# Patient Record
Sex: Male | Born: 1985 | Race: White | Hispanic: No | Marital: Single | Smoking: Current every day smoker
Health system: Southern US, Community
[De-identification: ages and names within clinical notes are randomized; demographics above are authoritative.]

## PROBLEM LIST (undated history)

## (undated) DIAGNOSIS — J45909 Unspecified asthma, uncomplicated: Secondary | ICD-10-CM

## (undated) DIAGNOSIS — R7989 Other specified abnormal findings of blood chemistry: Secondary | ICD-10-CM

## (undated) DIAGNOSIS — K219 Gastro-esophageal reflux disease without esophagitis: Secondary | ICD-10-CM

## (undated) DIAGNOSIS — I480 Paroxysmal atrial fibrillation: Secondary | ICD-10-CM

## (undated) DIAGNOSIS — K635 Polyp of colon: Secondary | ICD-10-CM

## (undated) DIAGNOSIS — L0390 Cellulitis, unspecified: Secondary | ICD-10-CM

## (undated) DIAGNOSIS — K2270 Barrett's esophagus without dysplasia: Secondary | ICD-10-CM

## (undated) HISTORY — DX: Paroxysmal atrial fibrillation: I48.0

## (undated) HISTORY — DX: Polyp of colon: K63.5

## (undated) HISTORY — DX: Other specified abnormal findings of blood chemistry: R79.89

## (undated) HISTORY — DX: Cellulitis, unspecified: L03.90

## (undated) HISTORY — DX: Barrett's esophagus without dysplasia: K22.70

## (undated) HISTORY — DX: Gastro-esophageal reflux disease without esophagitis: K21.9

---

## 2012-05-31 ENCOUNTER — Ambulatory Visit (INDEPENDENT_AMBULATORY_CARE_PROVIDER_SITE_OTHER): Payer: BC Managed Care – PPO | Admitting: Physician Assistant

## 2012-05-31 VITALS — BP 129/79 | HR 69 | Temp 98.1°F | Resp 18 | Ht 72.0 in | Wt 210.0 lb

## 2012-05-31 DIAGNOSIS — L02219 Cutaneous abscess of trunk, unspecified: Secondary | ICD-10-CM

## 2012-05-31 DIAGNOSIS — R208 Other disturbances of skin sensation: Secondary | ICD-10-CM

## 2012-05-31 DIAGNOSIS — R209 Unspecified disturbances of skin sensation: Secondary | ICD-10-CM

## 2012-05-31 MED ORDER — DOXYCYCLINE HYCLATE 100 MG PO CAPS: 100.0000 mg | ORAL_CAPSULE | Freq: Two times a day (BID) | ORAL | Status: AC

## 2012-05-31 NOTE — Progress Notes (Signed)
  Subjective:    Patient ID: Bernard Mcbride, male    DOB: Jan 13, 1986, 26 y.o.   MRN: [id number]  HPI Presents with sore spot on left waistline first noticed 2 days ago.  Initially he thought it was just where his pants were rubbing, but then realized it was "an infection."  He has a history of recurrent cellulitis.  He squeezed some thick pus out yesterday.  No fever, chills, nausea.  He reports feeling "under the weather" and tired, but nothing more specific. Works outside doing Youth worker.   Review of Systems As above.    Objective:   Physical Exam Vital signs noted. Well-developed, well nourished WM who is awake, alert and oriented, in NAD. Lungs: normal effort. Abdomen: normo-active bowel sounds, supple, non-tender, no mass or organomegaly. Skin: warm and dry. Area of erythema on the left anterior waist line with central induration, fluctuence and eschar.  Eschar lifted and a small amount of purulence expressed and collected for culture. Mupirocin ointment and bandaid applied.     Assessment & Plan:   1. Cellulitis/abscess - trunk  Wound culture, doxycycline (VIBRAMYCIN) 100 MG capsule  2. Pain of skin

## 2012-05-31 NOTE — Patient Instructions (Signed)
Apply a warm compress to the area for 15-20 minutes 2-3 times daily.  Keep the wound covered while it's actively draining, but leave it open to the air for some timer every day after that, during a time when you can keep it clean.  Wash the wound with soap and water daily.

## 2012-06-03 LAB — WOUND CULTURE
Gram Stain: NONE SEEN
Gram Stain: NONE SEEN

## 2013-06-23 ENCOUNTER — Telehealth: Payer: Self-pay

## 2013-06-23 ENCOUNTER — Ambulatory Visit (INDEPENDENT_AMBULATORY_CARE_PROVIDER_SITE_OTHER): Payer: BC Managed Care – PPO | Admitting: Physician Assistant

## 2013-06-23 VITALS — BP 130/98 | HR 66 | Temp 97.4°F | Resp 16 | Ht 72.5 in | Wt 215.0 lb

## 2013-06-23 DIAGNOSIS — M545 Other chronic pain: Secondary | ICD-10-CM | POA: Insufficient documentation

## 2013-06-23 DIAGNOSIS — J309 Allergic rhinitis, unspecified: Secondary | ICD-10-CM

## 2013-06-23 DIAGNOSIS — K219 Gastro-esophageal reflux disease without esophagitis: Secondary | ICD-10-CM

## 2013-06-23 DIAGNOSIS — J4521 Mild intermittent asthma with (acute) exacerbation: Secondary | ICD-10-CM

## 2013-06-23 DIAGNOSIS — R05 Cough: Secondary | ICD-10-CM

## 2013-06-23 DIAGNOSIS — J302 Other seasonal allergic rhinitis: Secondary | ICD-10-CM

## 2013-06-23 DIAGNOSIS — J45901 Unspecified asthma with (acute) exacerbation: Secondary | ICD-10-CM

## 2013-06-23 DIAGNOSIS — G8929 Other chronic pain: Secondary | ICD-10-CM

## 2013-06-23 MED ORDER — ESOMEPRAZOLE MAGNESIUM 40 MG PO PACK: 40.0000 mg | PACK | Freq: Every day | ORAL | Status: DC

## 2013-06-23 MED ORDER — BECLOMETHASONE DIPROPIONATE 80 MCG/ACT IN AERS: 1.0000 | INHALATION_SPRAY | RESPIRATORY_TRACT | Status: DC | PRN

## 2013-06-23 MED ORDER — ALBUTEROL SULFATE HFA 108 (90 BASE) MCG/ACT IN AERS: 2.0000 | INHALATION_SPRAY | RESPIRATORY_TRACT | Status: DC | PRN

## 2013-06-23 MED ORDER — GUAIFENESIN ER 1200 MG PO TB12: 1.0000 | ORAL_TABLET | Freq: Two times a day (BID) | ORAL | Status: DC | PRN

## 2013-06-23 MED ORDER — BECLOMETHASONE DIPROPIONATE 80 MCG/ACT IN AERS: 2.0000 | INHALATION_SPRAY | Freq: Two times a day (BID) | RESPIRATORY_TRACT | Status: DC

## 2013-06-23 MED ORDER — BENZONATATE 100 MG PO CAPS: 100.0000 mg | ORAL_CAPSULE | Freq: Three times a day (TID) | ORAL | Status: DC | PRN

## 2013-06-23 MED ORDER — IPRATROPIUM BROMIDE 0.03 % NA SOLN: 2.0000 | Freq: Two times a day (BID) | NASAL | Status: DC

## 2013-06-23 NOTE — Telephone Encounter (Signed)
Call from mother. Inhalers not working. Increased cough. Advised take son to ER for evluation and treatment. He lives in an appt. And she was calling. Call at 11 PM

## 2013-06-23 NOTE — Progress Notes (Signed)
  Subjective:    Patient ID: Bernard Mcbride, male    DOB: [birth year], 27 y.o.   MRN: [id number]  HPI  This 27 y.o. male presents for evaluation of cough x 3 weeks. Worse at night.  Occurs in jags, gasps for air during them.  Dry cough.  Has missed >1 week of work due to coughing. An inhaler helps (has one left over from something previously) temporarily (1-2 hours).  Has had this several times before, usually in the fall, treatment helps, but then symptoms recur.  This episode began when staying on a houseboat in [place]. Initially had nausea, but that resolved after the first 1-2 days.  Also initially had achiness. Sometimes feels "Short of breath when I shouldn't be," even when not coughing.  Past medical history, surgical history, family history, social history and problem list reviewed.  He has a history of "terrible GERD," but ran out of Nexium about the time his cough started.  Review of Systems As above.  Some blood-tinged sputum with particularly hard coughing. Some mild nasal congestion and drainage.    Objective:   Physical Exam Blood pressure 130/98, pulse 66, temperature 97.4 F (36.3 C), temperature source Oral, resp. rate 16, height 6' 0.5" (1.842 m), weight 215 lb (97.523 kg), SpO2 98.00%. Body mass index is 28.74 kg/(m^2). Well-developed, well nourished WM who is awake, alert and oriented, in NAD. HEENT: [place]/AT, PERRL, EOMI.  Sclera and conjunctiva are clear.  EAC are patent, TMs are normal in appearance. Nasal mucosa is pink and moist. OP is clear. Neck: supple, non-tender, no lymphadenopathy, thyromegaly. Heart: RRR, no murmur Lungs: normal effort, soft wheezes noted throughout. Extremities: no cyanosis, clubbing or edema. Skin: warm and dry without rash. Psychologic: good mood and appropriate affect, normal speech and behavior.        Assessment & Plan:  Cough - Plan: benzonatate (TESSALON) 100 MG capsule  Reactive airway disease, mild intermittent, with acute  exacerbation - Plan: albuterol (PROVENTIL HFA;VENTOLIN HFA) 108 (90 BASE) MCG/ACT inhaler, beclomethasone (QVAR) 80 MCG/ACT inhaler, DISCONTINUED: beclomethasone (QVAR) 80 MCG/ACT inhaler, likely exacerbated by GERD.  Seasonal allergic rhinitis - Plan: ipratropium (ATROVENT) 0.03 % nasal spray, Guaifenesin (MUCINEX MAXIMUM STRENGTH) 1200 MG TB12  GERD (gastroesophageal reflux disease) - Plan: esomeprazole (NEXIUM) 40 MG packet  Chronic low back pain - use acetaminophen for now.  Reassess once his GERD and cough are stable.  Consider use of meloxicam at that time.    Fernande Bras, PA-C Physician Assistant-Certified [facility]

## 2013-06-23 NOTE — Telephone Encounter (Signed)
Spoke with mother (sharon) and states inhalers and medications are not doing any good. He is still coughing bad. States was told to call if not better and maybe start on steroids or give a different cough medication. Please send to [facility] since they are 24 hours.

## 2013-06-23 NOTE — Telephone Encounter (Signed)
Pt was seen today by chelle and was told to call back to talk with her if not improving mom called stating patient is getting worse  Best number [phone]

## 2013-06-23 NOTE — Patient Instructions (Addendum)
Add an OTC antihistamine (like Allegra, Claritin or Zyrtec) daily.  Use acetaminophen (Tylenol) as needed for back pain, until we get the reflux and cough resolved.

## 2013-06-24 ENCOUNTER — Ambulatory Visit: Payer: BC Managed Care – PPO

## 2013-06-24 ENCOUNTER — Ambulatory Visit (INDEPENDENT_AMBULATORY_CARE_PROVIDER_SITE_OTHER): Payer: BC Managed Care – PPO | Admitting: Family Medicine

## 2013-06-24 VITALS — BP 110/80 | Temp 97.4°F | Resp 18 | Wt 214.0 lb

## 2013-06-24 DIAGNOSIS — R05 Cough: Secondary | ICD-10-CM

## 2013-06-24 DIAGNOSIS — J45901 Unspecified asthma with (acute) exacerbation: Secondary | ICD-10-CM

## 2013-06-24 DIAGNOSIS — J4521 Mild intermittent asthma with (acute) exacerbation: Secondary | ICD-10-CM

## 2013-06-24 DIAGNOSIS — J209 Acute bronchitis, unspecified: Secondary | ICD-10-CM

## 2013-06-24 LAB — POCT CBC
Hemoglobin: 17.4 g/dL (ref 14.1–18.1)
MPV: 10.6 fL (ref 0–99.8)
POC Granulocyte: 7.4 — AB (ref 2–6.9)
POC MID %: 5.4 %M (ref 0–12)
RBC: 5.76 M/uL (ref 4.69–6.13)

## 2013-06-24 MED ORDER — HYDROCOD POLST-CHLORPHEN POLST 10-8 MG/5ML PO LQCR
5.0000 mL | Freq: Two times a day (BID) | ORAL | Status: DC | PRN
Start: 2013-06-24 — End: 2013-06-30

## 2013-06-24 MED ORDER — AZITHROMYCIN 250 MG PO TABS: ORAL_TABLET | ORAL | Status: DC

## 2013-06-24 MED ORDER — PREDNISONE 10 MG PO TABS: ORAL_TABLET | ORAL | Status: DC

## 2013-06-24 NOTE — Progress Notes (Signed)
Subjective:    Patient ID: Bernard Mcbride, male    DOB: [birth year], 27 y.o.   MRN: [id number]   Chief Complaint  Patient presents with  . Cough  . Follow-up   HPI   For the past 3 wks has been avhing really severe coughing, mostly at night.  Will almost invariably start coughing around midnight - has bouts where no matter what he can't stop coughing.  Even while using albuterol and QVAR.  Albuterol seemed to be making it worse.  Did try the on-call doctor, Dr. Cleta Mcbride, who recommended that he be seen today for further eval.  Coughing fits can last for over 30 min.  Unable to sleep hardly at all. No further sputum production so no bloody sputum.  Coughing to the point of regurg now.  If he coughs a lot, will sometimes develop some nasal congestion or sneeze but no sig URI-type sxs. No f/c - though did when this first started. Has not smoked a cigarette x 3-4d. Does have a h/o seasonal allergies accompanied by seasonal environmental asthma but normally only flairs in the fall.  Review of Systems  Constitutional: Positive for activity change, appetite change and fatigue. Negative for fever and chills.  HENT: Negative for ear pain, congestion, sore throat, rhinorrhea, sneezing, trouble swallowing, neck pain, neck stiffness, postnasal drip, sinus pressure and ear discharge.   Respiratory: Positive for cough and wheezing. Negative for shortness of breath.   Cardiovascular: Negative for chest pain.  Gastrointestinal: Positive for vomiting. Negative for nausea, abdominal pain, diarrhea and constipation.  Genitourinary: Negative for dysuria and difficulty urinating.  Musculoskeletal: Negative for myalgias.  Hematological: Negative for adenopathy.  Psychiatric/Behavioral: Positive for sleep disturbance.      BP 110/80  Temp(Src) 97.4 F (36.3 C) (Oral)  Resp 18  Wt 214 lb (97.07 kg)  BMI 28.61 kg/m2  SpO2 98% Objective:   Physical Exam  Constitutional: He is oriented to person, place, and  time. He appears well-developed and well-nourished. No distress.  HENT:  Head: Normocephalic and atraumatic.  Right Ear: Tympanic membrane, external ear and ear canal normal.  Left Ear: Tympanic membrane, external ear and ear canal normal.  Nose: Nose normal.  Mouth/Throat: Oropharynx is clear and moist and mucous membranes are normal. No oropharyngeal exudate.  Eyes: Conjunctivae are normal. No scleral icterus.  Neck: Normal range of motion. Neck supple. No thyromegaly present.  Cardiovascular: Normal rate, regular rhythm, normal heart sounds and intact distal pulses.   Pulmonary/Chest: Effort normal. No respiratory distress. He has wheezes.  Mild insp wheeze Lt>Rt  Abdominal: Soft. Bowel sounds are normal. He exhibits no distension and no mass. There is no tenderness. There is no rebound and no guarding.  Musculoskeletal: He exhibits no edema.  Lymphadenopathy:    He has no cervical adenopathy.  Neurological: He is alert and oriented to person, place, and time.  Skin: Skin is warm and dry. He is not diaphoretic. No erythema.  Psychiatric: He has a normal mood and affect. His behavior is normal.   Results for orders placed in visit on 06/24/13  POCT CBC      Result Value Range   WBC 9.9  4.6 - 10.2 K/uL   Lymph, poc 1.9  0.6 - 3.4   POC LYMPH PERCENT 19.6  10 - 50 %L   MID (cbc) 0.5  0 - 0.9   POC MID % 5.4  0 - 12 %M   POC Granulocyte 7.4 (*) 2 - 6.9  Granulocyte percent 75.0  37 - 80 %G   RBC 5.76  4.69 - 6.13 M/uL   Hemoglobin 17.4  14.1 - 18.1 g/dL   HCT, POC 16.1  09.6 - 53.7 %   MCV 92.9  80 - 97 fL   MCH, POC 30.2  27 - 31.2 pg   MCHC 32.5  31.8 - 35.4 g/dL   RDW, POC 04.5     Platelet Count, POC 254  142 - 424 K/uL   MPV 10.6  0 - 99.8 fL       Peak flow: 650, goal 640 [facility] reading (PRIMARY) by  Dr. Clelia Mcbride. CXR: No acute abnormality, lung fields clear. Assessment & Plan:  Cough - Plan: DG Chest 2 View, POCT CBC  Acute bronchitis - try tussionex tonight.  Hopefully he will sxs improve a lot if he can just get some sleep at night.  However, if he is still coughing through the tussionex and still unable to rest, then he can start the prednisone taper and zpack tomorrow. If not sig improvement in 3-4d, RTC for further eval.  Meds ordered this encounter  Medications  . predniSONE (DELTASONE) 10 MG tablet    Sig: 6-5-4-3-2-1 po qam    Dispense:  21 tablet    Refill:  0  . azithromycin (ZITHROMAX) 250 MG tablet    Sig: Take 2 tabs PO x 1 dose, then 1 tab PO QD x 4 days    Dispense:  6 tablet    Refill:  0  . chlorpheniramine-HYDROcodone (TUSSIONEX PENNKINETIC ER) 10-8 MG/5ML LQCR    Sig: Take 5 mLs by mouth every 12 (twelve) hours as needed (cough).    Dispense:  140 mL    Refill:  0

## 2013-06-24 NOTE — Patient Instructions (Addendum)

## 2013-06-30 ENCOUNTER — Emergency Department (HOSPITAL_COMMUNITY): Payer: BC Managed Care – PPO

## 2013-06-30 ENCOUNTER — Encounter (HOSPITAL_COMMUNITY): Payer: Self-pay | Admitting: Emergency Medicine

## 2013-06-30 ENCOUNTER — Inpatient Hospital Stay (HOSPITAL_COMMUNITY)
Admission: EM | Admit: 2013-06-30 | Discharge: 2013-07-02 | DRG: 552 | Disposition: A | Discharge status: Home / Self Care | Payer: BC Managed Care – PPO | Attending: Internal Medicine | Admitting: Internal Medicine

## 2013-06-30 DIAGNOSIS — K219 Gastro-esophageal reflux disease without esophagitis: Secondary | ICD-10-CM | POA: Diagnosis present

## 2013-06-30 DIAGNOSIS — J4530 Mild persistent asthma, uncomplicated: Secondary | ICD-10-CM | POA: Diagnosis present

## 2013-06-30 DIAGNOSIS — K2971 Gastritis, unspecified, with bleeding: Principal | ICD-10-CM | POA: Diagnosis present

## 2013-06-30 DIAGNOSIS — R1110 Vomiting, unspecified: Secondary | ICD-10-CM

## 2013-06-30 DIAGNOSIS — Z79899 Other long term (current) drug therapy: Secondary | ICD-10-CM

## 2013-06-30 DIAGNOSIS — J302 Other seasonal allergic rhinitis: Secondary | ICD-10-CM

## 2013-06-30 DIAGNOSIS — N179 Acute kidney failure, unspecified: Secondary | ICD-10-CM | POA: Diagnosis present

## 2013-06-30 DIAGNOSIS — M545 Low back pain, unspecified: Secondary | ICD-10-CM | POA: Diagnosis present

## 2013-06-30 DIAGNOSIS — K920 Hematemesis: Secondary | ICD-10-CM | POA: Diagnosis present

## 2013-06-30 DIAGNOSIS — R112 Nausea with vomiting, unspecified: Secondary | ICD-10-CM

## 2013-06-30 DIAGNOSIS — F17200 Nicotine dependence, unspecified, uncomplicated: Secondary | ICD-10-CM | POA: Diagnosis present

## 2013-06-30 DIAGNOSIS — J45909 Unspecified asthma, uncomplicated: Secondary | ICD-10-CM | POA: Diagnosis present

## 2013-06-30 DIAGNOSIS — G8929 Other chronic pain: Secondary | ICD-10-CM | POA: Diagnosis present

## 2013-06-30 HISTORY — DX: Unspecified asthma, uncomplicated: J45.909

## 2013-06-30 LAB — COMPREHENSIVE METABOLIC PANEL
BUN: 27 mg/dL — ABNORMAL HIGH (ref 6–23)
Calcium: 11.1 mg/dL — ABNORMAL HIGH (ref 8.4–10.5)
Creatinine, Ser: 2.48 mg/dL — ABNORMAL HIGH (ref 0.50–1.35)
GFR calc Af Amer: 39 mL/min — ABNORMAL LOW (ref >90)
Glucose, Bld: 128 mg/dL — ABNORMAL HIGH (ref 70–99)
Total Protein: 9.4 g/dL — ABNORMAL HIGH (ref 6.0–8.3)

## 2013-06-30 LAB — URINALYSIS, ROUTINE W REFLEX MICROSCOPIC
Ketones, ur: 15 mg/dL — AB
Leukocytes, UA: NEGATIVE
Nitrite: NEGATIVE
Specific Gravity, Urine: 1.032 — ABNORMAL HIGH (ref 1.005–1.030)
pH: 5 (ref 5.0–8.0)

## 2013-06-30 LAB — URINE MICROSCOPIC-ADD ON

## 2013-06-30 LAB — OCCULT BLOOD, POC DEVICE: Fecal Occult Bld: NEGATIVE

## 2013-06-30 LAB — CBC WITH DIFFERENTIAL/PLATELET
Eosinophils Absolute: 0 10*3/uL (ref 0.0–0.7)
Eosinophils Relative: 0 % (ref 0–5)
Hemoglobin: 20.4 g/dL — ABNORMAL HIGH (ref 13.0–17.0)
Lymphs Abs: 1.5 10*3/uL (ref 0.7–4.0)
MCH: 30.4 pg (ref 26.0–34.0)
MCHC: 36.8 g/dL — ABNORMAL HIGH (ref 30.0–36.0)
MCV: 82.7 fL (ref 78.0–100.0)
Monocytes Relative: 8 % (ref 3–12)
RBC: 6.7 MIL/uL — ABNORMAL HIGH (ref 4.22–5.81)

## 2013-06-30 MED ORDER — SODIUM CHLORIDE 0.9 % IV BOLUS (SEPSIS)
2000.0000 mL | Freq: Once | INTRAVENOUS | Status: AC
  Administered 2013-06-30: 2000 mL via INTRAVENOUS

## 2013-06-30 MED ORDER — ONDANSETRON HCL 4 MG/2ML IJ SOLN
4.0000 mg | Freq: Once | INTRAMUSCULAR | Status: AC
  Administered 2013-06-30: 4 mg via INTRAVENOUS
  Filled 2013-06-30: qty 2

## 2013-06-30 MED ORDER — SODIUM CHLORIDE 0.9 % IV SOLN: INTRAVENOUS | Status: AC

## 2013-06-30 MED ORDER — SODIUM CHLORIDE 0.9 % IV BOLUS (SEPSIS)
1000.0000 mL | Freq: Once | INTRAVENOUS | Status: AC
  Administered 2013-06-30: 1000 mL via INTRAVENOUS

## 2013-06-30 MED ORDER — SODIUM CHLORIDE 0.9 % IV SOLN
80.0000 mg | Freq: Once | INTRAVENOUS | Status: AC
  Administered 2013-06-30: 80 mg via INTRAVENOUS
  Filled 2013-06-30: qty 80

## 2013-06-30 MED ORDER — MORPHINE SULFATE 4 MG/ML IJ SOLN
4.0000 mg | Freq: Once | INTRAMUSCULAR | Status: AC
  Administered 2013-06-30: 4 mg via INTRAVENOUS
  Filled 2013-06-30: qty 1

## 2013-06-30 MED ORDER — ONDANSETRON HCL 4 MG/2ML IJ SOLN: 4.0000 mg | Freq: Three times a day (TID) | INTRAMUSCULAR | Status: AC | PRN

## 2013-06-30 NOTE — ED Provider Notes (Signed)
CSN: [id number]     Arrival date & time 06/30/13  1908 History     First MD Initiated Contact with Patient 06/30/13 2038     Chief Complaint  Patient presents with  . Hematemesis   (Consider location/radiation/quality/duration/timing/severity/associated sxs/prior Treatment) HPI  Bernard Mcbride is a 27 y.o. male with past medical history significant for mild asthma, no history of intubations has approximately 3 exacerbations per year) complaining of hematemesis. Patient states that he has had approximately 20 episodes since 10 AM. Patient denies any alcohol use, fever, chills, sick contacts, abdominal pain, diarrhea. He endorses a 6/10 bilateral lower thoracic pain is exacerbated by movement and palpation. Patient also has associated sweating but no fever, says he feels dehydrated and thirsty. He was recently seen for bronchitis and completed a Z-Pak yesterday he is currently on a prednisone taper with several days left. Patient has history of GERD, states that he has these vomiting episodes every few weeks. Has never seen a gastroenterologist.   Past Medical History  Diagnosis Date  . Cellulitis     recurrent; MRSA; pre-patella, lip  . Asthma    History reviewed. No pertinent past surgical history. Family History  Problem Relation Age of Onset  . Diabetes Mother   . Hypertension Father    History  Substance Use Topics  . Smoking status: Current Some Day Smoker    Types: Cigarettes  . Smokeless tobacco: Former Neurosurgeon    Types: Snuff     Comment: working on quitting, down to <0.5 ppd  . Alcohol Use: No    Review of Systems 10 systems reviewed and found to be negative, except as noted in the HPI  Allergies  Review of patient's allergies indicates no known allergies.  Home Medications   Current Outpatient Rx  Name  Route  Sig  Dispense  Refill  . albuterol (PROVENTIL HFA;VENTOLIN HFA) 108 (90 BASE) MCG/ACT inhaler   Inhalation   Inhale 2 puffs into the lungs every 4 (four)  hours as needed for wheezing (cough, shortness of breath or wheezing.).   1 Inhaler   1   . azithromycin (ZITHROMAX) 250 MG tablet      Take 2 tabs PO x 1 dose, then 1 tab PO QD x 4 days   6 tablet   0   . esomeprazole (NEXIUM) 40 MG packet   Oral   Take 40 mg by mouth daily before breakfast.   30 each   12   . ondansetron (ZOFRAN) 4 MG tablet   Oral   Take 4 mg by mouth every 8 (eight) hours as needed for nausea.         . predniSONE (DELTASONE) 10 MG tablet      6-5-4-3-2-1 po qam   21 tablet   0   . beclomethasone (QVAR) 80 MCG/ACT inhaler   Inhalation   Inhale 2 puffs into the lungs 2 (two) times daily.   1 Inhaler   1   . benzonatate (TESSALON) 100 MG capsule   Oral   Take 1-2 capsules (100-200 mg total) by mouth 3 (three) times daily as needed for cough.   40 capsule   0   . chlorpheniramine-HYDROcodone (TUSSIONEX PENNKINETIC ER) 10-8 MG/5ML LQCR   Oral   Take 5 mLs by mouth every 12 (twelve) hours as needed (cough).   140 mL   0   . Guaifenesin (MUCINEX MAXIMUM STRENGTH) 1200 MG TB12   Oral   Take 1 tablet (1,200 mg total)  by mouth every 12 (twelve) hours as needed.   14 tablet   1   . ipratropium (ATROVENT) 0.03 % nasal spray   Nasal   Place 2 sprays into the nose 2 (two) times daily.   30 mL   0    BP 140/106  Pulse 119  Temp(Src) 98.5 F (36.9 C) (Oral)  Resp 22  Ht 6' (1.829 m)  Wt 198 lb 4 oz (89.926 kg)  BMI 26.88 kg/m2  SpO2 96% Physical Exam  Constitutional: He is oriented to person, place, and time.  Sweaty forehead, appears nervous.  HENT:  Head: Normocephalic.  Mouth/Throat: Oropharynx is clear and moist.  Mildly injected posterior pharynx  Eyes: Pupils are equal, round, and reactive to light.  Cardiovascular: Regular rhythm, normal heart sounds and intact distal pulses.   Tachycardic  Pulmonary/Chest: Effort normal. No respiratory distress. He has wheezes. He has no rales. He exhibits no tenderness.  Mild scattered  expiratory wheezing  Abdominal: Soft. Bowel sounds are normal. He exhibits no distension and no mass. There is no tenderness. There is no rebound and no guarding.  Musculoskeletal: Normal range of motion. He exhibits no edema.  Neurological: He is alert and oriented to person, place, and time.  Skin: Skin is warm.    ED Course   Procedures (including critical care time)  Labs Reviewed  CBC WITH DIFFERENTIAL - Abnormal; Notable for the following:    WBC 16.6 (*)    RBC 6.70 (*)    Hemoglobin 20.4 (*)    HCT 55.4 (*)    MCHC 36.8 (*)    Neutrophils Relative % 83 (*)    Neutro Abs 13.8 (*)    Lymphocytes Relative 9 (*)    Monocytes Absolute 1.4 (*)    All other components within normal limits  COMPREHENSIVE METABOLIC PANEL - Abnormal; Notable for the following:    Glucose, Bld 128 (*)    BUN 27 (*)    Creatinine, Ser 2.48 (*)    Calcium 11.1 (*)    Total Protein 9.4 (*)    Albumin 5.4 (*)    GFR calc non Af Amer 34 (*)    GFR calc Af Amer 39 (*)    All other components within normal limits  URINALYSIS, ROUTINE W REFLEX MICROSCOPIC  SODIUM, URINE, RANDOM  CREATININE, URINE, RANDOM  OCCULT BLOOD X 1 CARD TO LAB, STOOL  OCCULT BLOOD, POC DEVICE   Dg Chest 2 View  06/30/2013   *RADIOLOGY REPORT*  Clinical Data: Shortness of breath.  CHEST - 2 VIEW  Comparison: 06/24/2013.  Findings: No cardiomegaly.  Aortic arch is somewhat high in its appearance, but there is no evident kinking or enlargement.  Lungs clear well-aerated.  No effusion or pneumothorax.  Negative osseous structures.  IMPRESSION: No evidence of acute cardiopulmonary disease.   Original Report Authenticated By: Tiburcio Pea   1. Emesis   2. ARF (acute renal failure)     MDM   Filed Vitals:   06/30/13 1925  BP: 140/106  Pulse: 119  Temp: 98.5 F (36.9 C)  TempSrc: Oral  Resp: 22  Height: 6' (1.829 m)  Weight: 198 lb 4 oz (89.926 kg)  SpO2: 96%     Bernard Mcbride is a 27 y.o. male reports multiple  episodes of hematemesis starting this a.m. chest x-ray shows no widened mediastinum consistent with Boerhaave syndrome Patient's leukocytosis of 16.6. He is guaiac-negative. He is afebrile and tachycardic.  Patient has acute kidney injury with  creatinine of 2.48. He will require admission for hydration and serial CBCs.  Patient will be admitted to a telemetry bed under the care of Dr. Kizzie Bane  Medications  0.9 %  sodium chloride infusion (not administered)  ondansetron (ZOFRAN) injection 4 mg (not administered)  sodium chloride 0.9 % bolus 1,000 mL (0 mLs Intravenous Stopped 06/30/13 2316)  pantoprazole (PROTONIX) 80 mg in sodium chloride 0.9 % 100 mL IVPB (0 mg Intravenous Stopped 06/30/13 2226)  morphine 4 MG/ML injection 4 mg (4 mg Intravenous Given 06/30/13 2204)  ondansetron (ZOFRAN) injection 4 mg (4 mg Intravenous Given 06/30/13 2204)  sodium chloride 0.9 % bolus 2,000 mL (2,000 mLs Intravenous New Bag/Given 06/30/13 2316)     Wynetta Emery, PA-C 06/30/13 1610

## 2013-06-30 NOTE — ED Notes (Signed)
Pt states last week he was seen at his dr for cough  Pt states today he went to work and began to just feel tired and then became nauseated  Pt states he started to have vomiting and each time he did he noticed blood in it  Pt states he started vomiting 10am this morning and states he has been vomiting up until around 6pm  Pt states he is having pain in his back on both sides around the area of his lower lungs that feels like someone poking him with a broom handle

## 2013-06-30 NOTE — H&P (Addendum)
[facility] History and Physical  Bernard Mcbride ZOX:[id number] DOB: 02-Jul-1986 DOA: 06/30/2013  Referring physician: Lynetta Mare, ED-PA PCP: No primary provider on file.  Specialists: none currently  Chief Complaint: ?Gi bleed  HPI: Bernard Mcbride is a 27 y.o. male with recently diagnosed Asthma, presented to the emergency room today 06/30/2013 after numerous multiple episodes of emesis of what he states is blood. He states that he's never had this amount of emesis before back to back, although he carries a diagnosis of reflux, which is significant enough for him to feel nauseous and had vomiting about 3-4 times a month. He states that the blood at times. Seemed bright red and it seemed that chunks in he he has no gastroenterologist and has never been worked up for the same before. He does not take any NSAIDs. Does not drink alcohol but does smoke about a half-pack a day. He has no other medical illnesses. Workup in emergency room revealed BUN 27, creatinine 2.4, calcium 11.1, glucose 128, albumin 54, total protein 9.4, WBC 16.6, hemoglobin 20.4, hematocrit 55.4, predominant neutrophilia Two-view chest x-ray = no acute evidence of active cardiac, pulmonary disease   Review of Systems: The patient denies chills, fever, right or sputum dark stool, tarry stool. Has occasional abdominal pain with his GERD, but does not have that right now. No falls no weakness, no blurred vision, no double vision. No burning in the urine no dark stool or tarry stool  Past Medical History  Diagnosis Date  . Cellulitis     recurrent; MRSA; pre-patella, lip  . Asthma    History reviewed. No pertinent past surgical history. Social History:  reports that he has been smoking Cigarettes.  He has been smoking about 0.00 packs per day. He has quit using smokeless tobacco. His smokeless tobacco use included Snuff. He reports that he does not drink alcohol or use illicit drugs. Patient lives at home No Known  Allergies  Family History  Problem Relation Age of Onset  . Diabetes Mother   . Hypertension Father      Prior to Admission medications   Medication Sig Start Date End Date Taking? Authorizing Provider  albuterol (PROVENTIL HFA;VENTOLIN HFA) 108 (90 BASE) MCG/ACT inhaler Inhale 2 puffs into the lungs every 4 (four) hours as needed for wheezing (cough, shortness of breath or wheezing.). 06/23/13  Yes Chelle S Jeffery, PA-C  beclomethasone (QVAR) 80 MCG/ACT inhaler Inhale 2 puffs into the lungs 2 (two) times daily. 06/23/13  Yes Chelle S Jeffery, PA-C  benzonatate (TESSALON) 100 MG capsule Take 1-2 capsules (100-200 mg total) by mouth 3 (three) times daily as needed for cough. 06/23/13  Yes Chelle S Jeffery, PA-C  cetirizine (ZYRTEC) 10 MG tablet Take 10 mg by mouth every morning.   Yes Historical Provider, MD  esomeprazole (NEXIUM) 40 MG packet Take 40 mg by mouth daily before breakfast. 06/23/13  Yes Chelle S Jeffery, PA-C  ipratropium (ATROVENT) 0.03 % nasal spray Place 2 sprays into the nose 2 (two) times daily. 06/23/13  Yes Chelle S Jeffery, PA-C  ondansetron (ZOFRAN) 4 MG tablet Take 4 mg by mouth every 8 (eight) hours as needed for nausea.   Yes Historical Provider, MD  predniSONE (DELTASONE) 10 MG tablet Take 10-60 mg by mouth every morning. Day 1: 6 tablets, Day 2: 5 tablets, Day 3: 4 tablets, Day 4: 3 tablets, Day 5: 2 tablets, Day 6: 1 tablet. 06/26/13 07/01/13 Yes Historical Provider, MD   Physical Exam: Filed Vitals:  06/30/13 1925  BP: 140/106  Pulse: 119  Temp: 98.5 F (36.9 C)  TempSrc: Oral  Resp: 22  Height: 6' (1.829 m)  Weight: 89.926 kg (198 lb 4 oz)  SpO2: 96%     General:  Alert, pleasant, oriented, slightly anxious and tangential  Eyes: EOMI, intact, no pallor, no  ENT: Clinically clear. Moderate dentition  Neck: Soft supple  Cardiovascular: S1, S2 no murmur, rub, or gallop  Respiratory: Clinically clear  Abdomen: Soft, nontender, nondistended no rebound  or guarding  Skin: No lower extremity edema  Musculoskeletal: Range of motion intact  Psychiatric: Euthymic  Neurologic: Grossly intact moving all 4 limbs equally 5/5 power grossly-anxious to the point of some mild agitation  Labs on Admission:  Basic Metabolic Panel:  Recent Labs Lab 06/30/13 2145  NA 139  K 4.0  CL 96  CO2 22  GLUCOSE 128*  BUN 27*  CREATININE 2.48*  CALCIUM 11.1*   Liver Function Tests:  Recent Labs Lab 06/30/13 2145  AST 22  ALT 35  ALKPHOS 89  BILITOT 0.7  PROT 9.4*  ALBUMIN 5.4*   No results found for this basename: LIPASE, AMYLASE,  in the last 168 hours No results found for this basename: AMMONIA,  in the last 168 hours CBC:  Recent Labs Lab 06/24/13 1046 06/30/13 2145  WBC 9.9 16.6*  NEUTROABS  --  13.8*  HGB 17.4 20.4*  HCT 53.5 55.4*  MCV 92.9 82.7  PLT  --  286   Cardiac Enzymes: No results found for this basename: CKTOTAL, CKMB, CKMBINDEX, TROPONINI,  in the last 168 hours  BNP (last 3 results) No results found for this basename: PROBNP,  in the last 8760 hours CBG: No results found for this basename: GLUCAP,  in the last 168 hours  Radiological Exams on Admission: Dg Chest 2 View  06/30/2013   *RADIOLOGY REPORT*  Clinical Data: Shortness of breath.  CHEST - 2 VIEW  Comparison: 06/24/2013.  Findings: No cardiomegaly.  Aortic arch is somewhat high in its appearance, but there is no evident kinking or enlargement.  Lungs clear well-aerated.  No effusion or pneumothorax.  Negative osseous structures.  IMPRESSION: No evidence of acute cardiopulmonary disease.   Original Report Authenticated By: Tiburcio Pea    EKG: Independently reviewed.  Asessment/Plan Principal Problem:   GI bleed Active Problems:   GERD (gastroesophageal reflux disease)   Chronic low back pain   Asthma, mild persistent   1. ? GI bleed-I will hydrate the patient with the expectation that Hb and hematocrit will drop. He probably has some  secondary polycythemia secondary to smoking as well as hemoconcentration, secondary to significant volume depletion as he works outside International aid/development worker as has not had much to eat or drink. If he has further nausea, vomiting, please call, who ever is unassigned for gastroenterology at [facility]. He is hemodynamically stable at this time-his risk factors for GI bleed include use of steroids for asthma in addition to smoking 2. Acute kidney injury-hydrate patient. Expect indices to get better over the next 12-24 hours-repeat basic metabolic panel every 12 hrs 3. Asthma-well controlled. Has no wheezing, discontinue her known as this may one of the causes for GI bleed-continue albuterol 2 puffs every 4 when necessary, Tessalon Perles, 100 200 3 times a day, when necessary, Flovent 1 Mcbride twice a day 4. Allergic rhinitis. Continue Atrovent nasal spray  Please consider calling gastroenterology in the morning depending on whether patient has further GI bleed-he seems  very stable at this time  Code Status: Full  Family Communication: Mother and father discussed  Disposition Plan: I observation MedSurg  Time spent: 43  Mahala Menghini [facility] Pager [phone]  If 7PM-7AM, please contact night-coverage [email or web address] Password [facility] 06/30/2013, 11:42 PM

## 2013-07-01 DIAGNOSIS — N179 Acute kidney failure, unspecified: Secondary | ICD-10-CM

## 2013-07-01 DIAGNOSIS — R1110 Vomiting, unspecified: Secondary | ICD-10-CM

## 2013-07-01 LAB — CBC
HCT: 46 % (ref 39.0–52.0)
Hemoglobin: 15.7 g/dL (ref 13.0–17.0)
MCHC: 34.1 g/dL (ref 30.0–36.0)
MCV: 83.8 fL (ref 78.0–100.0)
Platelets: 283 10*3/uL (ref 150–400)
RBC: 6.19 MIL/uL — ABNORMAL HIGH (ref 4.22–5.81)
RBC: 5.4 MIL/uL (ref 4.22–5.81)
WBC: 13.9 10*3/uL — ABNORMAL HIGH (ref 4.0–10.5)

## 2013-07-01 LAB — BASIC METABOLIC PANEL
BUN: 25 mg/dL — ABNORMAL HIGH (ref 6–23)
BUN: 19 mg/dL (ref 6–23)
CO2: 28 mEq/L (ref 19–32)
CO2: 27 mEq/L (ref 19–32)
Chloride: 100 mEq/L (ref 96–112)
Chloride: 103 mEq/L (ref 96–112)
Creatinine, Ser: 1.13 mg/dL (ref 0.50–1.35)
GFR calc non Af Amer: 48 mL/min — ABNORMAL LOW (ref >90)
Glucose, Bld: 126 mg/dL — ABNORMAL HIGH (ref 70–99)
Glucose, Bld: 95 mg/dL (ref 70–99)
Potassium: 4.2 mEq/L (ref 3.5–5.1)

## 2013-07-01 LAB — CREATININE, URINE, RANDOM: Creatinine, Urine: 623 mg/dL

## 2013-07-01 MED ORDER — BIOTENE DRY MOUTH MT LIQD
15.0000 mL | Freq: Two times a day (BID) | OROMUCOSAL | Status: DC
  Administered 2013-07-01: 15 mL via OROMUCOSAL

## 2013-07-01 MED ORDER — PANTOPRAZOLE SODIUM 40 MG IV SOLR
40.0000 mg | Freq: Two times a day (BID) | INTRAVENOUS | Status: DC
  Administered 2013-07-01 – 2013-07-02 (×4): 40 mg via INTRAVENOUS
  Filled 2013-07-01 (×5): qty 40

## 2013-07-01 MED ORDER — SODIUM CHLORIDE 0.9 % IV BOLUS (SEPSIS)
1000.0000 mL | Freq: Once | INTRAVENOUS | Status: AC
  Administered 2013-07-01: 1000 mL via INTRAVENOUS

## 2013-07-01 MED ORDER — SODIUM CHLORIDE 0.9 % IV SOLN
INTRAVENOUS | Status: AC
  Administered 2013-07-01 – 2013-07-02 (×4): via INTRAVENOUS

## 2013-07-01 MED ORDER — IPRATROPIUM BROMIDE 0.03 % NA SOLN
2.0000 | Freq: Two times a day (BID) | NASAL | Status: DC
  Administered 2013-07-01: 2 via NASAL
  Filled 2013-07-01: qty 30

## 2013-07-01 MED ORDER — ALBUTEROL SULFATE HFA 108 (90 BASE) MCG/ACT IN AERS
2.0000 | INHALATION_SPRAY | RESPIRATORY_TRACT | Status: DC | PRN
  Filled 2013-07-01: qty 6.7

## 2013-07-01 MED ORDER — FLUTICASONE PROPIONATE HFA 44 MCG/ACT IN AERO
1.0000 | INHALATION_SPRAY | Freq: Two times a day (BID) | RESPIRATORY_TRACT | Status: DC
  Administered 2013-07-01 – 2013-07-02 (×3): 1 via RESPIRATORY_TRACT
  Filled 2013-07-01: qty 10.6

## 2013-07-01 MED ORDER — ONDANSETRON HCL 4 MG PO TABS: 4.0000 mg | ORAL_TABLET | Freq: Three times a day (TID) | ORAL | Status: DC | PRN

## 2013-07-01 MED ORDER — BENZONATATE 100 MG PO CAPS
100.0000 mg | ORAL_CAPSULE | Freq: Three times a day (TID) | ORAL | Status: DC | PRN
  Administered 2013-07-01: 200 mg via ORAL
  Filled 2013-07-01: qty 2

## 2013-07-01 MED ORDER — LORATADINE 10 MG PO TABS
10.0000 mg | ORAL_TABLET | Freq: Every day | ORAL | Status: DC
  Administered 2013-07-01 – 2013-07-02 (×2): 10 mg via ORAL
  Filled 2013-07-01 (×2): qty 1

## 2013-07-01 NOTE — Progress Notes (Signed)
Pt received to room 1440 via w/c, alert and oriented. Pt oriented to room, parents at bedside. Patient's vital signs are lying 161/113, hr 115. Sitting 153/110, hr 115. Standing 143/113, hr 121. Standing again was 151/100, hr 134. Midlevel notified of patient's b/p and hr. Will cont to monitor.

## 2013-07-01 NOTE — ED Provider Notes (Signed)
Medical screening examination/treatment/procedure(s) were conducted as a shared visit with non-physician practitioner(s) and myself.  I personally evaluated the patient during the encounter  Derwood Kaplan, MD 07/01/13 [phone]

## 2013-07-01 NOTE — Plan of Care (Signed)
Problem: Phase I Progression Outcomes Goal: Pain controlled with appropriate interventions Outcome: Progressing No c/o pain at this time.     

## 2013-07-01 NOTE — Plan of Care (Signed)
Problem: Consults Goal: Nutrition Consult-if indicated Outcome: Progressing Pt instructed in his "diet" of ice chips. Voiced understanding.

## 2013-07-01 NOTE — Progress Notes (Signed)
[facility] PROGRESS NOTE Assessment/Plan: AKI (acute kidney injury): - Bolus NS fluid. B-met in am. - Cr improved with bolus in ED. - Cont Iv fluids    Hematemesis/: - Hbg drop, ? Due hydration. - no baseline Hbg, no further hematemesis. - no alarming symptoms.  Asthma, mild persistent -Has no wheezing, continue albuterol 2 puffs every 4 when necessary.  GERD (gastroesophageal reflux disease): - PPI.   Code Status: Full  Family Communication: Mother and father discussed  Disposition Plan: I observation MedSurg    Consultants:  none  Procedures:  none  Antibiotics:  none (indicate start date, and stop date if known)  HPI/Subjective: No complains.   Objective: Filed Vitals:   07/01/13 0123 07/01/13 0124 07/01/13 0617 07/01/13 0925  BP: 143/113 151/100 138/98   Pulse: 121 134 90   Temp:   97.7 F (36.5 C)   TempSrc:   Oral   Resp:   18   Height:      Weight:      SpO2: 94% 96% 96% 96%   No intake or output data in the 24 hours ending 07/01/13 1003 Filed Weights   06/30/13 1925  Weight: 89.926 kg (198 lb 4 oz)    Exam:  General: Alert, awake, oriented x3, in no acute distress.  HEENT: No bruits, no goiter.  Heart: Regular rate and rhythm, without murmurs, rubs, gallops.  Lungs: Good air movement, clear to auscultation. Abdomen: Soft, nontender, nondistended, positive bowel sounds.  Neuro: Grossly intact, nonfocal.   Data Reviewed: Basic Metabolic Panel:  Recent Labs Lab 06/30/13 2145 07/01/13 0144  NA 139 141  K 4.0 4.2  CL 96 100  CO2 22 28  GLUCOSE 128* 126*  BUN 27* 25*  CREATININE 2.48* 1.84*  CALCIUM 11.1* 10.0   Liver Function Tests:  Recent Labs Lab 06/30/13 2145  AST 22  ALT 35  ALKPHOS 89  BILITOT 0.7  PROT 9.4*  ALBUMIN 5.4*   No results found for this basename: LIPASE, AMYLASE,  in the last 168 hours No results found for this basename: AMMONIA,  in the last 168 hours CBC:  Recent Labs Lab  06/30/13 2145 07/01/13 0144  WBC 16.6* 13.9*  NEUTROABS 13.8*  --   HGB 20.4* 18.6*  HCT 55.4* 51.9  MCV 82.7 83.8  PLT 286 283   Cardiac Enzymes: No results found for this basename: CKTOTAL, CKMB, CKMBINDEX, TROPONINI,  in the last 168 hours BNP (last 3 results) No results found for this basename: PROBNP,  in the last 8760 hours CBG: No results found for this basename: GLUCAP,  in the last 168 hours  No results found for this or any previous visit (from the past 240 hour(s)).   Studies: Dg Chest 2 View  06/30/2013   *RADIOLOGY REPORT*  Clinical Data: Shortness of breath.  CHEST - 2 VIEW  Comparison: 06/24/2013.  Findings: No cardiomegaly.  Aortic arch is somewhat high in its appearance, but there is no evident kinking or enlargement.  Lungs clear well-aerated.  No effusion or pneumothorax.  Negative osseous structures.  IMPRESSION: No evidence of acute cardiopulmonary disease.   Original Report Authenticated By: Tiburcio Pea    Scheduled Meds: . sodium chloride   Intravenous STAT  . antiseptic oral rinse  15 mL Mouth Rinse BID  . fluticasone  1 puff Inhalation BID  . ipratropium  2 spray Nasal BID  . loratadine  10 mg Oral Daily  . pantoprazole (PROTONIX) IV  40 mg Intravenous Q12H  .  sodium chloride  1,000 mL Intravenous Once   Continuous Infusions: . sodium chloride       Marinda Elk  [facility] Pager [phone]. If 8PM-8AM, please contact night-coverage at [email or web address], password [facility] 07/01/2013, 10:03 AM  LOS: 1 day

## 2013-07-02 DIAGNOSIS — J45909 Unspecified asthma, uncomplicated: Secondary | ICD-10-CM

## 2013-07-02 LAB — BASIC METABOLIC PANEL
BUN: 16 mg/dL (ref 6–23)
BUN: 9 mg/dL (ref 6–23)
Calcium: 8.8 mg/dL (ref 8.4–10.5)
Creatinine, Ser: 0.91 mg/dL (ref 0.50–1.35)
GFR calc Af Amer: >90 mL/min (ref >90)
GFR calc Af Amer: >90 mL/min (ref >90)
GFR calc non Af Amer: >90 mL/min (ref >90)
GFR calc non Af Amer: >90 mL/min (ref >90)
Glucose, Bld: 85 mg/dL (ref 70–99)
Potassium: 3.5 mEq/L (ref 3.5–5.1)
Sodium: 138 mEq/L (ref 135–145)

## 2013-07-02 LAB — CBC
HCT: 42.2 % (ref 39.0–52.0)
MCH: 29.7 pg (ref 26.0–34.0)
MCH: 29.8 pg (ref 26.0–34.0)
MCHC: 34.8 g/dL (ref 30.0–36.0)
MCHC: 34.9 g/dL (ref 30.0–36.0)
MCV: 85.3 fL (ref 78.0–100.0)
RDW: 13.2 % (ref 11.5–15.5)
RDW: 13 % (ref 11.5–15.5)

## 2013-07-02 NOTE — Progress Notes (Signed)
Report given to [facility] on 6E. Pt alert and oriented, VSS, will transfer via wheelchair.

## 2013-07-02 NOTE — Discharge Summary (Signed)
Physician Discharge Summary  Bernard Mcbride ZOX:[id number] DOB: 1986/02/06 DOA: 06/30/2013  PCP: No primary provider on file.  Admit date: 06/30/2013 Discharge date: 07/02/2013  Time spent: 40 minutes  Recommendations for Outpatient Follow-up:  -Advised to follow up with his PCP in 2 weeks.   Discharge Diagnoses:  Principal Problem:   AKI (acute kidney injury) Active Problems:   GERD (gastroesophageal reflux disease)   Chronic low back pain   Hematemesis   Asthma, mild persistent   Discharge Condition: Stable and improved  Filed Weights   06/30/13 1925  Weight: 89.926 kg (198 lb 4 oz)    History of present illness:  Patient is a 27 y.o. male with recently diagnosed Asthma, presented to the emergency room today 06/30/2013 after numerous multiple episodes of emesis of what he states is blood. He states that he's never had this amount of emesis before back to back, although he carries a diagnosis of reflux, which is significant enough for him to feel nauseous and had vomiting about 3-4 times a month. He states that the blood at times. Seemed bright red and it seemed that chunks in he he has no gastroenterologist and has never been worked up for the same before. He does not take any NSAIDs. Does not drink alcohol but does smoke about a half-pack a day. He has no other medical illnesses.  Workup in emergency room revealed BUN 27, creatinine 2.4, calcium 11.1, glucose 128, albumin 54, total protein 9.4, WBC 16.6, hemoglobin 20.4, hematocrit 55.4, predominant neutrophilia. We were asked to admit him for further evaluation and management.    Hospital Course:   ARF -Resolved. -Suspect prerenal azotemia 2/2 ongoing GI losses.  Hematemesis -Mild in nature. -Hb remained stable above 15. -Suspect mild gastritis from GERD. -Was recently started on nexium by his PCP. -Transfusion/GI consult not required this admission.  GERD -Continue PPI.  Asthma -Stable this  hospitalization.   Procedures:  None   Consultations:  None  Discharge Instructions  Discharge Orders   Future Orders Complete By Expires     Discontinue IV  As directed     Increase activity slowly  As directed         Medication List    STOP taking these medications       azithromycin 250 MG tablet  Commonly known as:  ZITHROMAX     predniSONE 10 MG tablet  Commonly known as:  DELTASONE      TAKE these medications       albuterol 108 (90 BASE) MCG/ACT inhaler  Commonly known as:  PROVENTIL HFA;VENTOLIN HFA  Inhale 2 puffs into the lungs every 4 (four) hours as needed for wheezing (cough, shortness of breath or wheezing.).     beclomethasone 80 MCG/ACT inhaler  Commonly known as:  QVAR  Inhale 2 puffs into the lungs 2 (two) times daily.     benzonatate 100 MG capsule  Commonly known as:  TESSALON  Take 1-2 capsules (100-200 mg total) by mouth 3 (three) times daily as needed for cough.     cetirizine 10 MG tablet  Commonly known as:  ZYRTEC  Take 10 mg by mouth every morning.     esomeprazole 40 MG packet  Commonly known as:  NEXIUM  Take 40 mg by mouth daily before breakfast.     ipratropium 0.03 % nasal spray  Commonly known as:  ATROVENT  Place 2 sprays into the nose 2 (two) times daily.     ondansetron 4 MG tablet  Commonly known as:  ZOFRAN  Take 4 mg by mouth every 8 (eight) hours as needed for nausea.       No Known Allergies    The results of significant diagnostics from this hospitalization (including imaging, microbiology, ancillary and laboratory) are listed below for reference.    Significant Diagnostic Studies: Dg Chest 2 View  06/30/2013   *RADIOLOGY REPORT*  Clinical Data: Shortness of breath.  CHEST - 2 VIEW  Comparison: 06/24/2013.  Findings: No cardiomegaly.  Aortic arch is somewhat high in its appearance, but there is no evident kinking or enlargement.  Lungs clear well-aerated.  No effusion or pneumothorax.  Negative osseous  structures.  IMPRESSION: No evidence of acute cardiopulmonary disease.   Original Report Authenticated By: Tiburcio Pea   Dg Chest 2 View  06/24/2013   *RADIOLOGY REPORT*  Clinical Data: Wheezing.  Coughing.  Shortness of breath.  CHEST - 2 VIEW  Comparison: None.  Findings: Cardiac silhouette is normal size and shape.  No pulmonary infiltrates or masses are seen.  No hilar enlargement is evident. No pleural abnormality is evident. Bones appear average for age.  IMPRESSION: No acute or active cardiopulmonary or pleural abnormalities are identified.  Clinically significant discrepancy from primary report, if provided: None   Original Report Authenticated By: Onalee Hua Call    Microbiology: No results found for this or any previous visit (from the past 240 hour(s)).   Labs: Basic Metabolic Panel:  Recent Labs Lab 06/30/13 2145 07/01/13 0144 07/01/13 1319 07/02/13 0105 07/02/13 1325  NA 139 141 140 137 138  K 4.0 4.2 4.3 3.5 3.5  CL 96 100 103 103 102  CO2 22 28 27 25 25   GLUCOSE 128* 126* 95 85 95  BUN 27* 25* 19 16 9   CREATININE 2.48* 1.84* 1.13 0.91 0.75  CALCIUM 11.1* 10.0 9.3 8.6 8.8   Liver Function Tests:  Recent Labs Lab 06/30/13 2145  AST 22  ALT 35  ALKPHOS 89  BILITOT 0.7  PROT 9.4*  ALBUMIN 5.4*   No results found for this basename: LIPASE, AMYLASE,  in the last 168 hours No results found for this basename: AMMONIA,  in the last 168 hours CBC:  Recent Labs Lab 06/30/13 2145 07/01/13 0144 07/01/13 1319 07/02/13 0105 07/02/13 1325  WBC 16.6* 13.9* 8.4 9.2 5.1  NEUTROABS 13.8*  --   --   --   --   HGB 20.4* 18.6* 15.7 14.7 15.3  HCT 55.4* 51.9 46.0 42.2 43.8  MCV 82.7 83.8 85.2 85.3 85.4  PLT 286 283 238 187 188   Cardiac Enzymes: No results found for this basename: CKTOTAL, CKMB, CKMBINDEX, TROPONINI,  in the last 168 hours BNP: BNP (last 3 results) No results found for this basename: PROBNP,  in the last 8760 hours CBG: No results found for this  basename: GLUCAP,  in the last 168 hours     Signed:  Chaya Jan  [facility] Pager: [phone] 07/02/2013, 7:18 PM

## 2013-07-03 NOTE — Progress Notes (Signed)
Discharge summary sent to payer through [facility]  

## 2013-11-06 ENCOUNTER — Encounter: Payer: Self-pay | Admitting: Gastroenterology

## 2013-12-06 ENCOUNTER — Encounter (HOSPITAL_COMMUNITY): Payer: Self-pay | Admitting: Emergency Medicine

## 2013-12-06 ENCOUNTER — Emergency Department (HOSPITAL_COMMUNITY)
Admission: EM | Admit: 2013-12-06 | Discharge: 2013-12-06 | Disposition: A | Discharge status: Home / Self Care | Payer: BC Managed Care – PPO | Source: Home / Self Care

## 2013-12-06 DIAGNOSIS — J111 Influenza due to unidentified influenza virus with other respiratory manifestations: Secondary | ICD-10-CM

## 2013-12-06 LAB — CBC WITH DIFFERENTIAL/PLATELET
Basophils Absolute: 0 10*3/uL (ref 0.0–0.1)
Basophils Relative: 0 % (ref 0–1)
EOS PCT: 1 % (ref 0–5)
Eosinophils Absolute: 0.1 10*3/uL (ref 0.0–0.7)
HEMATOCRIT: 52.9 % — AB (ref 39.0–52.0)
Hemoglobin: 19.3 g/dL — ABNORMAL HIGH (ref 13.0–17.0)
LYMPHS ABS: 1.7 10*3/uL (ref 0.7–4.0)
LYMPHS PCT: 20 % (ref 12–46)
MCH: 30.6 pg (ref 26.0–34.0)
MCHC: 36.5 g/dL — ABNORMAL HIGH (ref 30.0–36.0)
MCV: 84 fL (ref 78.0–100.0)
MONO ABS: 0.6 10*3/uL (ref 0.1–1.0)
Monocytes Relative: 7 % (ref 3–12)
Neutro Abs: 5.8 10*3/uL (ref 1.7–7.7)
Neutrophils Relative %: 72 % (ref 43–77)
Platelets: 223 10*3/uL (ref 150–400)
RBC: 6.3 MIL/uL — ABNORMAL HIGH (ref 4.22–5.81)
RDW: 13.1 % (ref 11.5–15.5)
WBC: 8.1 10*3/uL (ref 4.0–10.5)

## 2013-12-06 LAB — POCT I-STAT, CHEM 8
BUN: 11 mg/dL (ref 6–23)
CALCIUM ION: 1.14 mmol/L (ref 1.12–1.23)
CREATININE: 1.2 mg/dL (ref 0.50–1.35)
Chloride: 100 mEq/L (ref 96–112)
Glucose, Bld: 120 mg/dL — ABNORMAL HIGH (ref 70–99)
HCT: 57 % — ABNORMAL HIGH (ref 39.0–52.0)
Hemoglobin: 19.4 g/dL — ABNORMAL HIGH (ref 13.0–17.0)
Potassium: 4.1 mEq/L (ref 3.7–5.3)
Sodium: 142 mEq/L (ref 137–147)
TCO2: 25 mmol/L (ref 0–100)

## 2013-12-06 MED ORDER — ONDANSETRON HCL 4 MG/2ML IJ SOLN
4.0000 mg | Freq: Once | INTRAMUSCULAR | Status: AC
  Administered 2013-12-06: 4 mg via INTRAVENOUS

## 2013-12-06 MED ORDER — OSELTAMIVIR PHOSPHATE 75 MG PO CAPS: 75.0000 mg | ORAL_CAPSULE | Freq: Two times a day (BID) | ORAL | Status: DC

## 2013-12-06 MED ORDER — ONDANSETRON HCL 4 MG PO TABS: 4.0000 mg | ORAL_TABLET | Freq: Three times a day (TID) | ORAL | Status: DC | PRN

## 2013-12-06 MED ORDER — ONDANSETRON 4 MG PO TBDP: 4.0000 mg | ORAL_TABLET | Freq: Three times a day (TID) | ORAL | Status: DC | PRN

## 2013-12-06 MED ORDER — ONDANSETRON HCL 4 MG/2ML IJ SOLN
INTRAMUSCULAR | Status: AC
  Filled 2013-12-06: qty 2

## 2013-12-06 NOTE — ED Notes (Signed)
C/o vomiting onset Fri. -30 x and all day today.  No diarrhea or abdominal pain.  C/o chills and sweating.

## 2013-12-06 NOTE — Discharge Instructions (Signed)
You likely have the flu and are very dehydrated. Fortunately your body has responded well to the stress of the flu.  Start Tamiflu as this will shorten the length of your illness. If you continue to be unable to keep anything down you may need to go to the emergency room. Use tylenol as needed for fever and pain relief.  You may use the zofran for the nausea. You should still go get your flu shot after you get well

## 2013-12-06 NOTE — ED Provider Notes (Signed)
CSN: [id number]     Arrival date & time 12/06/13  1848 History   None    Chief Complaint  Patient presents with  . Emesis   (Consider location/radiation/quality/duration/timing/severity/associated sxs/prior Treatment) HPI  Emesis: chills and sweats for past 36 hours. Subjective fevers. Associated w/ runny nose and cough. Emesis "non-stop". Unable to keep down fluids.   Past Medical History  Diagnosis Date  . Cellulitis     recurrent; MRSA; pre-patella, lip  . Asthma     seasonal   History reviewed. No pertinent past surgical history. Family History  Problem Relation Age of Onset  . Diabetes Mother   . Hypertension Father    History  Substance Use Topics  . Smoking status: Former Smoker    Types: Cigarettes    Quit date: 11/15/2013  . Smokeless tobacco: Former Neurosurgeon    Types: Snuff     Comment: working on quitting, down to <0.5 ppd  . Alcohol Use: No    Review of Systems  Constitutional: Positive for fever, chills, diaphoresis, activity change, appetite change and fatigue.  Respiratory: Positive for cough.   Gastrointestinal: Positive for nausea and vomiting.  All other systems reviewed and are negative.    Allergies  Review of patient's allergies indicates no known allergies.  Home Medications   Current Outpatient Rx  Name  Route  Sig  Dispense  Refill  . beclomethasone (QVAR) 80 MCG/ACT inhaler   Inhalation   Inhale 2 puffs into the lungs 2 (two) times daily.   1 Inhaler   1   . cetirizine (ZYRTEC) 10 MG tablet   Oral   Take 10 mg by mouth every morning.         Marland Kitchen esomeprazole (NEXIUM) 40 MG packet   Oral   Take 40 mg by mouth daily before breakfast.   30 each   12   . promethazine (PHENERGAN) 25 MG suppository   Rectal   Place 25 mg rectally every 6 (six) hours as needed for nausea or vomiting.         Marland Kitchen albuterol (PROVENTIL HFA;VENTOLIN HFA) 108 (90 BASE) MCG/ACT inhaler   Inhalation   Inhale 2 puffs into the lungs every 4 (four) hours as  needed for wheezing (cough, shortness of breath or wheezing.).   1 Inhaler   1   . benzonatate (TESSALON) 100 MG capsule   Oral   Take 1-2 capsules (100-200 mg total) by mouth 3 (three) times daily as needed for cough.   40 capsule   0   . ipratropium (ATROVENT) 0.03 % nasal spray   Nasal   Place 2 sprays into the nose 2 (two) times daily.   30 mL   0   . ondansetron (ZOFRAN) 4 MG tablet   Oral   Take 1 tablet (4 mg total) by mouth every 8 (eight) hours as needed for nausea.   20 tablet   0   . ondansetron (ZOFRAN-ODT) 4 MG disintegrating tablet   Oral   Take 1 tablet (4 mg total) by mouth every 8 (eight) hours as needed for nausea or vomiting.   30 tablet   0   . oseltamivir (TAMIFLU) 75 MG capsule   Oral   Take 1 capsule (75 mg total) by mouth 2 (two) times daily.   10 capsule   0    BP 150/110  Pulse 100  Temp(Src) 97.8 F (36.6 C) (Oral)  Resp 26  SpO2 100% Physical Exam  Constitutional: He is oriented  to person, place, and time. He appears well-developed and well-nourished. He appears distressed.  HENT:  Head: Normocephalic and atraumatic.  Dry mucus membranes  Eyes: EOM are normal. Pupils are equal, round, and reactive to light.  Neck: Normal range of motion. Neck supple.  Cardiovascular: Normal rate, normal heart sounds and intact distal pulses.   Pulmonary/Chest: Effort normal and breath sounds normal. No respiratory distress. He has no wheezes. He has no rales. He exhibits no tenderness.  Abdominal: Soft. Bowel sounds are normal. He exhibits no distension.  Musculoskeletal: Normal range of motion. He exhibits no edema and no tenderness.  Neurological: He is alert and oriented to person, place, and time.  Skin: No rash noted. He is diaphoretic. No erythema. No pallor.  Psychiatric: He has a normal mood and affect. His behavior is normal. Judgment and thought content normal.    ED Course  Procedures (including critical care time) Labs Review Labs  Reviewed  CBC WITH DIFFERENTIAL - Abnormal; Notable for the following:    RBC 6.30 (*)    Hemoglobin 19.3 (*)    HCT 52.9 (*)    MCHC 36.5 (*)    All other components within normal limits  POCT I-STAT, CHEM 8 - Abnormal; Notable for the following:    Glucose, Bld 120 (*)    Hemoglobin 19.4 (*)    HCT 57.0 (*)    All other components within normal limits   Imaging Review No results found.  EKG Interpretation    Date/Time:    Ventricular Rate:    PR Interval:    QRS Duration:   QT Interval:    QTC Calculation:   R Axis:     Text Interpretation:              MDM   1. Flu    28yo M w/ flu. Dehydrated based on lab work. Zofran and 1L NS bolus in UC office w/ significant improvement in pts subjective symptoms and HR dropping to below 100. Pt w/ asthma which puts him at risk for complications from flu - tamiflu - zofran - tylenol, gatorade, rest, bland diet.....  Shelly Flattenavid Lean Fayson, MD Family Medicine PGY-3 12/06/2013, 8:40 PM     Ozella Rocksavid J Shalunda Lindh, MD 12/06/13 2040

## 2013-12-09 NOTE — ED Provider Notes (Signed)
Medical screening examination/treatment/procedure(s) were performed by resident physician or non-physician practitioner and as supervising physician I was immediately available for consultation/collaboration.   KINDL,JAMES DOUGLAS MD.   James D Kindl, MD 12/09/13 0849 

## 2013-12-16 ENCOUNTER — Ambulatory Visit: Payer: BC Managed Care – PPO | Admitting: Gastroenterology

## 2013-12-16 ENCOUNTER — Telehealth: Payer: Self-pay | Admitting: Gastroenterology

## 2013-12-16 NOTE — Telephone Encounter (Signed)
Per Dr Christella HartiganJacobs, please charge no show fee.

## 2014-01-13 ENCOUNTER — Ambulatory Visit: Payer: BC Managed Care – PPO | Admitting: Gastroenterology

## 2014-06-22 ENCOUNTER — Observation Stay (HOSPITAL_COMMUNITY)
Admission: EM | Admit: 2014-06-22 | Discharge: 2014-06-23 | Disposition: A | Discharge status: Home / Self Care | Payer: BC Managed Care – PPO | Attending: Cardiology | Admitting: Cardiology

## 2014-06-22 ENCOUNTER — Ambulatory Visit (INDEPENDENT_AMBULATORY_CARE_PROVIDER_SITE_OTHER): Payer: BC Managed Care – PPO | Admitting: Family Medicine

## 2014-06-22 ENCOUNTER — Encounter (HOSPITAL_COMMUNITY): Payer: Self-pay | Admitting: Emergency Medicine

## 2014-06-22 VITALS — BP 110/80 | HR 79 | Temp 97.8°F | Resp 16 | Ht 74.5 in | Wt 191.4 lb

## 2014-06-22 DIAGNOSIS — R112 Nausea with vomiting, unspecified: Secondary | ICD-10-CM

## 2014-06-22 DIAGNOSIS — R51 Headache: Secondary | ICD-10-CM

## 2014-06-22 DIAGNOSIS — E860 Dehydration: Secondary | ICD-10-CM

## 2014-06-22 DIAGNOSIS — F17200 Nicotine dependence, unspecified, uncomplicated: Secondary | ICD-10-CM | POA: Insufficient documentation

## 2014-06-22 DIAGNOSIS — J45909 Unspecified asthma, uncomplicated: Secondary | ICD-10-CM | POA: Insufficient documentation

## 2014-06-22 DIAGNOSIS — I4891 Unspecified atrial fibrillation: Principal | ICD-10-CM | POA: Diagnosis present

## 2014-06-22 DIAGNOSIS — K219 Gastro-esophageal reflux disease without esophagitis: Secondary | ICD-10-CM | POA: Insufficient documentation

## 2014-06-22 DIAGNOSIS — IMO0002 Reserved for concepts with insufficient information to code with codable children: Secondary | ICD-10-CM | POA: Insufficient documentation

## 2014-06-22 DIAGNOSIS — Z872 Personal history of diseases of the skin and subcutaneous tissue: Secondary | ICD-10-CM | POA: Insufficient documentation

## 2014-06-22 DIAGNOSIS — Z79899 Other long term (current) drug therapy: Secondary | ICD-10-CM | POA: Insufficient documentation

## 2014-06-22 DIAGNOSIS — Z860100 Personal history of colon polyps, unspecified: Secondary | ICD-10-CM | POA: Insufficient documentation

## 2014-06-22 DIAGNOSIS — R42 Dizziness and giddiness: Secondary | ICD-10-CM | POA: Insufficient documentation

## 2014-06-22 DIAGNOSIS — R000 Tachycardia, unspecified: Secondary | ICD-10-CM | POA: Insufficient documentation

## 2014-06-22 DIAGNOSIS — L237 Allergic contact dermatitis due to plants, except food: Secondary | ICD-10-CM

## 2014-06-22 DIAGNOSIS — L255 Unspecified contact dermatitis due to plants, except food: Secondary | ICD-10-CM

## 2014-06-22 LAB — BASIC METABOLIC PANEL
Anion gap: 16 — ABNORMAL HIGH (ref 5–15)
BUN: 12 mg/dL (ref 6–23)
CO2: 25 mEq/L (ref 19–32)
Calcium: 9.3 mg/dL (ref 8.4–10.5)
Chloride: 99 mEq/L (ref 96–112)
Creatinine, Ser: 0.92 mg/dL (ref 0.50–1.35)
Glucose, Bld: 89 mg/dL (ref 70–99)
POTASSIUM: 3.9 meq/L (ref 3.7–5.3)
Sodium: 140 mEq/L (ref 137–147)

## 2014-06-22 LAB — CBC
HCT: 50.2 % (ref 39.0–52.0)
Hemoglobin: 18.3 g/dL — ABNORMAL HIGH (ref 13.0–17.0)
MCH: 31.2 pg (ref 26.0–34.0)
MCHC: 36.5 g/dL — ABNORMAL HIGH (ref 30.0–36.0)
MCV: 85.5 fL (ref 78.0–100.0)
PLATELETS: 191 10*3/uL (ref 150–400)
RBC: 5.87 MIL/uL — AB (ref 4.22–5.81)
RDW: 12.9 % (ref 11.5–15.5)
WBC: 9.6 10*3/uL (ref 4.0–10.5)

## 2014-06-22 LAB — I-STAT TROPONIN, ED: TROPONIN I, POC: 0 ng/mL (ref 0.00–0.08)

## 2014-06-22 MED ORDER — TRAMADOL HCL 50 MG PO TABS
50.0000 mg | ORAL_TABLET | Freq: Once | ORAL | Status: AC
Start: 2014-06-22 — End: 2014-06-22
  Administered 2014-06-22: 50 mg via ORAL
  Filled 2014-06-22: qty 1

## 2014-06-22 MED ORDER — FLECAINIDE ACETATE 100 MG PO TABS
300.0000 mg | ORAL_TABLET | Freq: Once | ORAL | Status: AC
  Administered 2014-06-22: 300 mg via ORAL
  Filled 2014-06-22: qty 3

## 2014-06-22 MED ORDER — HEPARIN BOLUS VIA INFUSION
4000.0000 [IU] | Freq: Once | INTRAVENOUS | Status: AC
  Administered 2014-06-22: 4000 [IU] via INTRAVENOUS
  Filled 2014-06-22: qty 4000

## 2014-06-22 MED ORDER — DILTIAZEM LOAD VIA INFUSION
15.0000 mg | Freq: Once | INTRAVENOUS | Status: DC
  Filled 2014-06-22: qty 15

## 2014-06-22 MED ORDER — HEPARIN (PORCINE) IN NACL 100-0.45 UNIT/ML-% IJ SOLN
1800.0000 [IU]/h | INTRAMUSCULAR | Status: DC
Start: 2014-06-22 — End: 2014-06-23
  Administered 2014-06-22: 1300 [IU]/h via INTRAVENOUS
  Filled 2014-06-22 (×3): qty 250

## 2014-06-22 MED ORDER — DILTIAZEM HCL 100 MG IV SOLR
5.0000 mg/h | INTRAVENOUS | Status: DC
  Administered 2014-06-22: 5 mg/h via INTRAVENOUS
  Filled 2014-06-22: qty 100

## 2014-06-22 MED ORDER — MAGNESIUM SULFATE IN D5W 10-5 MG/ML-% IV SOLN
1.0000 g | Freq: Once | INTRAVENOUS | Status: AC
  Administered 2014-06-22: 1 g via INTRAVENOUS
  Filled 2014-06-22: qty 100

## 2014-06-22 MED ORDER — DILTIAZEM HCL 25 MG/5ML IV SOLN
20.0000 mg | Freq: Once | INTRAVENOUS | Status: AC
  Administered 2014-06-22: 20 mg via INTRAVENOUS
  Filled 2014-06-22: qty 5

## 2014-06-22 NOTE — Patient Instructions (Signed)
Transfer to [facility] 

## 2014-06-22 NOTE — ED Notes (Addendum)
Contacted Pharmacy about Cardizem wait time.

## 2014-06-22 NOTE — ED Notes (Signed)
Pt reports lower back pain that is chronic. Pt states that laying in bed is making it worse and that he normally takes Tramadol.

## 2014-06-22 NOTE — ED Notes (Signed)
MD at bedside. 

## 2014-06-22 NOTE — ED Provider Notes (Signed)
CSN: [id number]     Arrival date & time 06/22/14  1829 History   First MD Initiated Contact with Patient 06/22/14 1830     Chief Complaint  Patient presents with  . Atrial Fibrillation     (Consider location/radiation/quality/duration/timing/severity/associated sxs/prior Treatment) HPI Rip HarbourDaniel T Grzywacz is a 28 y.o. male that presents to the ED from urgent care for evaluation of new onset atrial fibrillation. Patient with history of asthma, GERD and Barrett's esophagus. Patient states that this morning he had dizziness, nausea and vomiting. The dizziness occurred with sitting up too quickly. No loss of consciousness or falls. No chest pain, shortness of breath or palpitations. He went to work and overall did not feel well and was sent to urgent care. While at urgent care, he was found to have atrial fibrillation and was [facility]. P.Dizziness, nausea, vomiting. Dizziness when getting up quickly. No chest pain. He has had similar episodes that first occurred about one and a half years ago.  Past Medical History  Diagnosis Date  . Cellulitis     recurrent; MRSA; pre-patella, lip  . Asthma     seasonal  . Barrett's esophagus   . Colon polyp, hyperplastic   . GERD (gastroesophageal reflux disease)    History reviewed. No pertinent past surgical history. Family History  Problem Relation Age of Onset  . Diabetes Mother   . Hypertension Father   . Breast cancer    . Bladder Cancer    . Prostate cancer    . Heart attack    . Diabetes    . Hypertension      History  Substance Use Topics  . Smoking status: Current Every Day Smoker -- 0.25 packs/day for 10 years    Types: Cigarettes    Last Attempt to Quit: 11/15/2013  . Smokeless tobacco: Former NeurosurgeonUser    Types: Snuff     Comment: working on quitting, down to <0.5 ppd  . Alcohol Use: 0.6 oz/week    1 Cans of beer per week   Review of Systems  Constitutional: Negative for fever and chills.  Cardiovascular: Negative for chest  pain and palpitations.  Gastrointestinal: Positive for nausea and vomiting.  Neurological: Positive for dizziness. Negative for syncope, weakness and headaches.  All other systems reviewed and are negative.     Allergies  Review of patient's allergies indicates no known allergies.  Home Medications   Prior to Admission medications   Medication Sig Start Date End Date Taking? Authorizing Provider  albuterol (PROVENTIL HFA;VENTOLIN HFA) 108 (90 BASE) MCG/ACT inhaler Inhale 2 puffs into the lungs every 4 (four) hours as needed for wheezing (cough, shortness of breath or wheezing.). 06/23/13  Yes Chelle S Jeffery, PA-C  beclomethasone (QVAR) 80 MCG/ACT inhaler Inhale 2 puffs into the lungs 2 (two) times daily. 06/23/13  Yes Chelle S Jeffery, PA-C  calcium carbonate (TUMS - DOSED IN MG ELEMENTAL CALCIUM) 500 MG chewable tablet Chew 1-2 tablets by mouth daily as needed for indigestion or heartburn.   Yes Historical Provider, MD  esomeprazole (NEXIUM) 40 MG packet Take 40 mg by mouth daily before breakfast. 06/23/13  Yes Chelle S Jeffery, PA-C  traMADol (ULTRAM) 50 MG tablet Take 50 mg by mouth every 6 (six) hours as needed for moderate pain.   Yes Historical Provider, MD   BP 116/82  Pulse 81  Temp(Src) 98.4 F (36.9 C) (Oral)  Resp 19  Ht 6' 2.5" (1.892 m)  Wt 191 lb 6.4 oz (86.818  kg)  BMI 24.25 kg/m2  SpO2 100% Physical Exam  Constitutional: He is oriented to person, place, and time. He appears well-developed and well-nourished.  Eyes: Conjunctivae and EOM are normal. Pupils are equal, round, and reactive to light.  Neck: Normal range of motion. Neck supple.  Cardiovascular: Normal heart sounds and normal pulses.  An irregularly irregular rhythm present. Tachycardia present.   Pulmonary/Chest: Effort normal and breath sounds normal. He has no decreased breath sounds. He has no wheezes. He has no rhonchi.  Abdominal: Soft. Normal appearance and bowel sounds are normal. There is no  tenderness.  Neurological: He is alert and oriented to person, place, and time.    ED Course  Procedures (including critical care time) Labs Review Labs Reviewed  BASIC METABOLIC PANEL - Abnormal; Notable for the following:    Anion gap 16 (*)    All other components within normal limits  CBC - Abnormal; Notable for the following:    RBC 5.87 (*)    Hemoglobin 18.3 (*)    MCHC 36.5 (*)    All other components within normal limits  HEPARIN LEVEL (UNFRACTIONATED)  CBC  I-STAT TROPOININ, ED    Imaging Review No results found.   EKG Interpretation   Date/Time:  Monday June 22 2014 18:36:13 EDT Ventricular Rate:  96 PR Interval:  170 QRS Duration: 97 QT Interval:  349 QTC Calculation: 441 R Axis:   87 Text Interpretation:  Age not entered, assumed to be  28 years old for  purpose of ECG interpretation Unknown rhythm, irregular rate Anteroseptal  infarct, old No previous tracing Confirmed by BEATON  MD, ROBERT ([id number])  on 06/22/2014 7:24:23 PM      MDM   Final diagnoses:  Atrial fibrillation with RVR   Upon entering the room, patient in RVR with heart rates ranging from 110s to 130s. Patient asymptomatic throughout episodes; no chest pain, shortness of breath or palpitations. EKG shows atrial fibrillation and not significant for acute MI. Troponin x1 is negative. Labs remarkable for polycythemia, which is not new, and anion gap of 16. Patient currently not having nausea or vomiting and feels totally normal. Given Cardizem bolus of 20mg  with rate response down to 80s and sustained atrial fibrillation. Cardiology consulted and agreed to place patient in observation to try and convert to regular rhythm.    Jacquelin Hawking, MD 06/22/14 2337

## 2014-06-22 NOTE — Progress Notes (Signed)
ANTICOAGULATION CONSULT NOTE - Initial Consult  Pharmacy Consult for Heparin Indication: atrial fibrillation  No Known Allergies  Patient Measurements: Height: 6' 2.5" (189.2 cm) Weight: 191 lb 6.4 oz (86.818 kg) IBW/kg (Calculated) : 83.35 Heparin Dosing Weight: 86 kg  Vital Signs: Temp: 98.4 F (36.9 C) (07/20 1837) Temp src: Oral (07/20 1837) BP: 112/69 mmHg (07/20 2100) Pulse Rate: 90 (07/20 2115)  Labs:  Recent Labs  06/22/14 1856  HGB 18.3*  HCT 50.2  PLT 191  CREATININE 0.92    Estimated Creatinine Clearance: 139.8 ml/min (by C-G formula based on Cr of 0.92).   Medical History: Past Medical History  Diagnosis Date  . Cellulitis     recurrent; MRSA; pre-patella, lip  . Asthma     seasonal  . Barrett's esophagus   . Colon polyp, hyperplastic   . GERD (gastroesophageal reflux disease)     Medications:  See electronic med rec  Assessment: Bernard Mcbride presents with dizziness, N/V. Pt found to be in atrial fibrillation. To begin heparin. CBC stable at baseline.  Goal of Therapy:  Heparin level 0.3-0.7 units/ml Monitor platelets by anticoagulation protocol: Yes   Plan:  1. Heparin IV bolus 4000 units 2. Heparin gtt at 1300 units/hr 3. Will f/u heparin level in 6 hours 4. Daily heparin level and CBC  Bernard Mcbride, PharmD, BCPS Clinical pharmacist, pager [phone] 06/22/2014,9:33 PM

## 2014-06-22 NOTE — ED Notes (Signed)
Contacted pharmacy about Heparin

## 2014-06-22 NOTE — ED Notes (Signed)
Cardiology at bedside.

## 2014-06-22 NOTE — ED Notes (Signed)
PT HR noted to increase to 150's. Pt continues to deny any pain, palpitations or discomfort. AO x4. Family at bedside. MD made aware.

## 2014-06-22 NOTE — ED Notes (Signed)
Pt denies CP or SOB. Has gotten roughly of fluid by EMS.

## 2014-06-22 NOTE — Progress Notes (Signed)
Subjective: 28 year old man who was fine yesterday. This morning when he got up he had dizziness and a headache mostly on the right side of his head with a twitching sensation in his head. He was nauseated and vomiting. He has not had anything to drink all day. He says the last time he got the dizziness like this he was dehydrated, but he feels too nauseous to take anything. He is not on any regular medications. He had a normal weekend and a wrestler yesterday. He drank a little beer on Friday night but has not had any other things that might have upset him. He lives with his fiance, but she has not been ill. Generally he is healthy. He is a Materials engineercommercial landscaper. No diarrhea. No abdominal pain. No chest pain. He says he is aware occasionally of his heart fluttering a little, but not a major issue to him.he feels that it was laying flat, better if he is laying propped up, worse if he is standing or seated.  Objective: Pleasant alert gentleman who looks like he doesn't feel well. TMs normal. Eyes PERRLA. Fundi normal with discs clear disc margins clear. Is throat is normal. Neck supple without nodes or thyromegaly. Chest clear to auscultation. Heart has a controlled rate but is very irregular. His abdomen is soft without mass or tenderness. Extremities without edema. Orthostatic vital signs were checked his blood pressure was 102/72, 96/70, 90/80.  No edema.contact dermatitis on ankles from weed eating, presumably poison ivy..  EKG a to fibrillation with a fairly controlled ventricular response  Assessment: New-onset atrial fibrillation Vomiting Dehydration and orthostasis Contact dermatitis  Plan: Hydrate Patient was moved to a procedure and observation bed, IV started, and arrangements made to send him into the hospital. EMS was promptly here. And will need further evaluation. We will arrange transport to the hospital.  Physician contact time 40 min 5:10-5:50,emergent

## 2014-06-22 NOTE — ED Notes (Signed)
Pt from [facility], found out to have new onset afib, Pt feels lightheaded, orthostatic changes. Nausea, vomiting about 3-4 times today. Loss of appetite.  had a similar symptoms about a year ago, due to dehydration. Unsure if he had afib last time. Pt is AAOX4, skin warm and dry, in NAD. States he feels slightly nauseous. Given 4 zofran by EMS at 1811. Denies new pain (chronic back pain).

## 2014-06-22 NOTE — H&P (Addendum)
Physician History and Physical    RHYKER SILVERSMITH MRN: [id number] DOB/AGE: 1986-08-09 28 y.o. Admit date: 06/22/2014  Primary Care Physician: Janace Hoard Primary Cardiologist: New  HPI: 28 yo with history of GERD/Barrett's esophagus and mild intermittent asthma presented with "dizziness" and nausea and was found to be in atrial fibrillation with mild RVR in the 100s-110s initially.  Patient was in his usual state of health yesterday, felt good.  At baseline, no exertional dyspnea or chest pain, no palpitations. He had one episode about 2 months ago where he felt short of breath and had palpitations, but this resolved in a few hours.  Today, he woke up feeling nauseated and "dizzy."  He vomited several times during the day.  He was lightheaded with standing and felt like he may be dehydrated.  He says that he ate and drank normally yesterday.  No dyspnea, palpitations, chest pain.  He had a couple of beers Friday but none Saturday or Sunday. No drugs.  He came to the ER and got IV fluid.  He felt better with IV fluid.  He was put on a diltiazem gtt and HR decreased to the 70s.  He was also started on heparin gtt.  His girlfriend says that he snores very loudly at night. He has no known cardiac problems.    PMH: 1. Elevated BP: Says that BP is up and down, no formal HTN diagnosis and on no meds. 2. GERD with h/o Barrett's esophagus 3. Low back pain 4. Asthma: No recent inhaler use 5. Atrial fibrillation: First noted in 7/15.   Review of systems complete and found to be negative unless listed above   Family History  Problem Relation Age of Onset  . Diabetes Mother   . Hypertension Father   . Breast cancer    . Bladder Cancer    . Prostate cancer    . Heart attack    . Diabetes    . Hypertension    GF with atrial fibrillation Uncle with MI in his 16s  Social History  . Marital Status: Single    Spouse Name: engaged    Number of Children: 0  . Years of Education: Assoc.    Occupational History  . Landscaping    Social History Main Topics  . Smoking status: 1 pack in 3-4 days    Types: Cigarettes    Quit date: Active  . Smokeless tobacco: Former Neurosurgeon    Types: Snuff     Comment: working on quitting, down to <0.5 ppd  . Alcohol Use: Occasional  . Drug Use: No  . Sexual Activity: Not on file   Other Topics Concern  . Not on file   Social History Narrative   Lives with his fiance.    Physical Exam: Blood pressure 120/80, pulse 85, temperature 98.4 F (36.9 C), temperature source Oral, resp. rate 21, height 6' 2.5" (1.892 m), weight 191 lb 6.4 oz (86.818 kg), SpO2 100.00%.  General: NAD Neck: No JVD, no thyromegaly or thyroid nodule.  Lungs: Clear to auscultation bilaterally with normal respiratory effort. CV: Nondisplaced PMI.  Heart irregular S1/S2, no S3/S4, no murmur.  No peripheral edema.  No carotid bruit.  Normal pedal pulses.  Abdomen: Soft, nontender, no hepatosplenomegaly, no distention.  Skin: Intact without lesions or rashes.  Neurologic: Alert and oriented x 3.  Psych: Normal affect. Extremities: No clubbing or cyanosis.  HEENT: Normal.   Labs:   Lab Results  Component Value Date  WBC 9.6 06/22/2014   HGB 18.3* 06/22/2014   HCT 50.2 06/22/2014   MCV 85.5 06/22/2014   PLT 191 06/22/2014    Recent Labs Lab 06/22/14 1856  NA 140  K 3.9  CL 99  CO2 25  BUN 12  CREATININE 0.92  CALCIUM 9.3  GLUCOSE 89  Troponin 0   Radiology: - CXR: No acute abnormalities  EKG: Atrial fibrillation at 78, nonspecific inferior T wave flattening  ASSESSMENT AND PLAN: 28 yo with history of GERD/Barrett's esophagus and mild intermittent asthma presented with "dizziness" and nausea and was found to be in atrial fibrillation with mild RVR in the 100s-110s initially.  The only true risk factor for atrial fibrillation that I can identify is possible OSA.  He denies illicit drugs or heavy ETOH.  CHADSVASC = 0.  He woke up with symptoms today and  felt completely normal yesterday.  I suspect that atrial fibrillation has been < 48 hours in duration.  He is now on diltiazem gtt and heparin gtt.   - I will try to convert him to NSR.  Given < 48 hour duration, will give him a dose of flecainide 300 mg x 1.  If this fails, will keep him NPO for cardioversion tomorrow (should still be within 48 hour window).  I discussed the risk of CVA with chemical or electrical cardioversion. However, this should be very low given short duration of symptoms and minimal risk factors.  - Continue heparin gtt peri-cardioversion.  CHADSVASC = 0, so I do not think that he will need prolonged anticoagulation.  - Check TSH, BNP, urine drug screen - Will get echo to assess for structural cardiac abnormalities.   Signed: Marca AnconaDalton Antwian Santaana 06/22/2014, 10:20 PM

## 2014-06-23 ENCOUNTER — Encounter: Payer: Self-pay | Admitting: Physician Assistant

## 2014-06-23 ENCOUNTER — Other Ambulatory Visit: Payer: Self-pay | Admitting: Physician Assistant

## 2014-06-23 DIAGNOSIS — I480 Paroxysmal atrial fibrillation: Secondary | ICD-10-CM

## 2014-06-23 LAB — HEPARIN LEVEL (UNFRACTIONATED)
HEPARIN UNFRACTIONATED: 0.22 [IU]/mL — AB (ref 0.30–0.70)
HEPARIN UNFRACTIONATED: 0.33 [IU]/mL (ref 0.30–0.70)

## 2014-06-23 LAB — CBC
HCT: 47.7 % (ref 39.0–52.0)
Hemoglobin: 16.8 g/dL (ref 13.0–17.0)
MCH: 30.8 pg (ref 26.0–34.0)
MCHC: 35.2 g/dL (ref 30.0–36.0)
MCV: 87.5 fL (ref 78.0–100.0)
Platelets: 184 10*3/uL (ref 150–400)
RBC: 5.45 MIL/uL (ref 4.22–5.81)
RDW: 13.1 % (ref 11.5–15.5)
WBC: 6.4 10*3/uL (ref 4.0–10.5)

## 2014-06-23 LAB — TROPONIN I
Troponin I: <0.3 ng/mL (ref <0.30)
Troponin I: <0.3 ng/mL (ref <0.30)

## 2014-06-23 LAB — TSH: TSH: 0.179 u[IU]/mL — ABNORMAL LOW (ref 0.350–4.500)

## 2014-06-23 LAB — T4, FREE: Free T4: 1.31 ng/dL (ref 0.80–1.80)

## 2014-06-23 LAB — PRO B NATRIURETIC PEPTIDE: Pro B Natriuretic peptide (BNP): 199.4 pg/mL — ABNORMAL HIGH (ref 0–125)

## 2014-06-23 MED ORDER — FLUTICASONE PROPIONATE HFA 44 MCG/ACT IN AERO
1.0000 | INHALATION_SPRAY | Freq: Two times a day (BID) | RESPIRATORY_TRACT | Status: DC
  Filled 2014-06-23: qty 10.6

## 2014-06-23 MED ORDER — METOPROLOL TARTRATE 25 MG PO TABS: 25.0000 mg | ORAL_TABLET | Freq: Two times a day (BID) | ORAL | Status: DC

## 2014-06-23 MED ORDER — PANTOPRAZOLE SODIUM 40 MG PO TBEC
40.0000 mg | DELAYED_RELEASE_TABLET | Freq: Every day | ORAL | Status: DC
Start: 2014-06-23 — End: 2014-06-23
  Administered 2014-06-23: 40 mg via ORAL
  Filled 2014-06-23 (×2): qty 2

## 2014-06-23 MED ORDER — METOPROLOL TARTRATE 25 MG PO TABS
25.0000 mg | ORAL_TABLET | Freq: Two times a day (BID) | ORAL | Status: DC
  Filled 2014-06-23: qty 1

## 2014-06-23 MED ORDER — TRAMADOL HCL 50 MG PO TABS: 50.0000 mg | ORAL_TABLET | Freq: Four times a day (QID) | ORAL | Status: DC | PRN

## 2014-06-23 MED ORDER — OFF THE BEAT BOOK
Freq: Once | Status: AC
  Administered 2014-06-23: 03:00:00
  Filled 2014-06-23: qty 1

## 2014-06-23 MED ORDER — ALBUTEROL SULFATE (2.5 MG/3ML) 0.083% IN NEBU
3.0000 mL | INHALATION_SOLUTION | RESPIRATORY_TRACT | Status: DC | PRN
Start: 2014-06-23 — End: 2014-06-23

## 2014-06-23 NOTE — Discharge Summary (Signed)
Discharge Summary   Patient ID: Bernard Mcbride,  MRN: [id number], DOB/AGE: Feb 12, 1986 28 y.o.  Admit date: 06/22/2014 Discharge date: 06/23/2014  Primary Care Provider: Pcp Not In System Primary Cardiologist: Dr. Shirlee Latch  Discharge Diagnoses Active Problems:   Atrial fibrillation   Allergies No Known Allergies   Hospital Course  The patient is a 28 year old with past medical history of acid reflux however no past cardiac history. He presented to [facility] ED on 06/22/2014 with episodes of dizziness and nausea and was found to be in atrial fibrillation with RVR with heart rate in the 100 to 110s initially. He also states he felt dehydrated. He was given IV fluid in the emergency room and also placed on diltiazem drip and heparin drip. His heart rate was in the 60 to 70s overnight. He was given one dose of 300 mg Fleicanide in an attempt for chemical cardioversion since his onset is less than 48 hours duration. He was seen the morning of 06/23/2014, at which time he denies any significant chest discomfort or shortness of breath and his dizziness has resolved. Serial troponin has been negative. Pro BNP was 199. It was noted that the patient has significantly low TSH of 0.179. Free T4 and urine drug test is currently pending. Shortly after he was seen, around 8:32 AM on 7/21, patient converted back to normal sinus rhythm after a 2 second post-termination pulse. Since then, his heart rate has been stable in the 50s to 60s.   Patient is deemed stable for discharge from cardiology perspective. I have scheduled a followup appointment with endocrinologist to followup for potential hyperthyroidism. Patient will be discharged with 25 mg twice a day of metoprolol for further rate control. Outpatient echocardiogram will be scheduled prior to cardiology followup in one month. We will not start any anticoagulation for this patient now he's back in sinus rhythm and has a CHA2DS2-Vasc score of 0. Patient and  his mother has been advised to call cardiology and obtain EKG if has recurrent symptom. A copy of his lab work and discharge summary will be faxed to Dr. Daune Perch office prior to his appointment for possible hyperthyroidism.   Discharge Vitals Blood pressure 96/60, pulse 72, temperature 97.9 F (36.6 C), temperature source Oral, resp. rate 18, height 6' (1.829 m), weight 197 lb 15.5 oz (89.799 kg), SpO2 96.00%.  Filed Weights   06/22/14 2115 06/22/14 2345 06/23/14 0400  Weight: 191 lb 6.4 oz (86.818 kg) 198 lb 3.2 oz (89.903 kg) 197 lb 15.5 oz (89.799 kg)    Labs  CBC  Recent Labs  06/22/14 1856 06/23/14 0032  WBC 9.6 6.4  HGB 18.3* 16.8  HCT 50.2 47.7  MCV 85.5 87.5  PLT 191 184   Basic Metabolic Panel  Recent Labs  06/22/14 1856  NA 140  K 3.9  CL 99  CO2 25  GLUCOSE 89  BUN 12  CREATININE 0.92  CALCIUM 9.3   Cardiac Enzymes  Recent Labs  06/23/14 0032 06/23/14 0535  TROPONINI <0.30 <0.30   Thyroid Function Tests  Recent Labs  06/23/14 0032  TSH 0.179*    Disposition  Pt is being discharged home today in good condition.  Follow-up Plans & Appointments      Follow-up Information   Follow up with KERR,JEFFREY, MD On 07/01/2014. (9:40am. Lab work and discharge summary will be faxed to Dr. Daune Perch office (fax # [phone]))    Specialty:  Endocrinology   Contact information:   [address] [place] [address] [phone]       [facility]. (Office will call patient to schedule followup. Patient will also need outpatient echo in the clinic prior to followup, office will schedule as well. If you do not hear from us in 2 business day, please call to schedule)    Specialty:  Cardiology   Contact information:   [address], [address] [place] [address] [phone]      Discharge Medications    Medication List         albuterol 108 (90 BASE) MCG/ACT inhaler  Commonly known as:   PROVENTIL HFA;VENTOLIN HFA  Inhale 2 puffs into the lungs every 4 (four) hours as needed for wheezing (cough, shortness of breath or wheezing.).     beclomethasone 80 MCG/ACT inhaler  Commonly known as:  QVAR  Inhale 2 puffs into the lungs 2 (two) times daily.     calcium carbonate 500 MG chewable tablet  Commonly known as:  TUMS - dosed in mg elemental calcium  Chew 1-2 tablets by mouth daily as needed for indigestion or heartburn.     esomeprazole 40 MG packet  Commonly known as:  NEXIUM  Take 40 mg by mouth daily before breakfast.     metoprolol tartrate 25 MG tablet  Commonly known as:  LOPRESSOR  Take 1 tablet (25 mg total) by mouth 2 (two) times daily.     traMADol 50 MG tablet  Commonly known as:  ULTRAM  Take 50 mg by mouth every 6 (six) hours as needed for moderate pain.        Outstanding Labs/Studies  Pending Urine Drug test and Free T4 Outpatient echocardiogram prior to next follow up  Duration of Discharge Encounter   Greater than 30 minutes including physician time.  Ramond DialSigned, Meng, Hao PA-C 06/23/2014, 11:05 AM   Patient seen and examined.  Plan as discussed in my rounding note for today and outlined above. Bernard [facility]bride [facility]  06/23/2014  11:34 AM

## 2014-06-23 NOTE — Progress Notes (Signed)
ANTICOAGULATION CONSULT NOTE - Follow Up Consult  Pharmacy Consult for heparin Indication: atrial fibrillation  Labs:  Recent Labs  06/22/14 1856 06/23/14 0032  HGB 18.3* 16.8  HCT 50.2 47.7  PLT 191 184  HEPARINUNFRC  --  0.33  CREATININE 0.92  --     Assessment: 28yo male therapeutic on heparin with initial dosing for Afib though at low end of goal and lab was drawn just ~2h after bolus so expect level may drop.  Goal of Therapy:  Heparin level 0.3-0.7 units/ml   Plan:  Will increase heparin gtt to 1500 units/hr and check level with next troponin.  Vernard GamblesVeronda Miquan Tandon, PharmD, BCPS  06/23/2014,1:19 AM

## 2014-06-23 NOTE — ED Provider Notes (Signed)
I saw and evaluated the patient, reviewed the resident's note and I agree with the findings and plan.   .Face to face Exam:  General:  Awake HEENT:  Atraumatic Resp:  Normal effort Abd:  Nondistended Neuro:No focal weakness   EKG reviewed by me and discussed with resident.  EKG was afib with RVR  CRITICAL CARE Performed by: Nelva NayBEATON,Colie Fugitt L Total critical care time: 30 min Critical care time was exclusive of separately billable procedures and treating other patients. Critical care was necessary to treat or prevent imminent or life-threatening deterioration. Critical care was time spent personally by me on the following activities: development of treatment plan with patient and/or surrogate as well as nursing, discussions with consultants, evaluation of patient's response to treatment, examination of patient, obtaining history from patient or surrogate, ordering and performing treatments and interventions, ordering and review of laboratory studies, ordering and review of radiographic studies, pulse oximetry and re-evaluation of patient's condition.   Nelia Shiobert L Kaniah Rizzolo, MD 06/23/14 1537

## 2014-06-23 NOTE — Progress Notes (Signed)
Patient Name: Bernard Mcbride Date of Encounter: 06/23/2014     Active Problems:   Atrial fibrillation    SUBJECTIVE  Never had chest discomfort or palpitation, only symptom was dizziness. Dizziness resolved after rate under control. Never known to have thyroid problem. Denies any SOB.  CURRENT MEDS . fluticasone  1 puff Inhalation BID  . pantoprazole  40 mg Oral QAC breakfast    OBJECTIVE  Filed Vitals:   06/22/14 2253 06/22/14 2345 06/23/14 0400 06/23/14 0500  BP: 116/82 106/71 87/61 108/68  Pulse: 81 82 60 68  Temp:  97.2 F (36.2 C) 97.7 F (36.5 C)   TempSrc:   Oral   Resp:  20 18   Height:  6' (1.829 m)    Weight:  198 lb 3.2 oz (89.903 kg) 197 lb 15.5 oz (89.799 kg)   SpO2: 100% 98% 99%    No intake or output data in the 24 hours ending 06/23/14 0743 Filed Weights   06/22/14 2115 06/22/14 2345 06/23/14 0400  Weight: 191 lb 6.4 oz (86.818 kg) 198 lb 3.2 oz (89.903 kg) 197 lb 15.5 oz (89.799 kg)    PHYSICAL EXAM  General: Pleasant, NAD. Neuro: Alert and oriented X 3. Moves all extremities spontaneously. Psych: Normal affect. HEENT:  Normal  Neck: Supple without bruits or JVD. Lungs:  Resp regular and unlabored, CTA. Heart: irregular no s3, s4, or murmurs. Abdomen: Soft, non-tender, non-distended, BS + x 4.  Extremities: No clubbing, cyanosis or edema. DP/PT/Radials 2+ and equal bilaterally.  Accessory Clinical Findings  CBC  Recent Labs  06/22/14 1856 06/23/14 0032  WBC 9.6 6.4  HGB 18.3* 16.8  HCT 50.2 47.7  MCV 85.5 87.5  PLT 191 184   Basic Metabolic Panel  Recent Labs  06/22/14 1856  NA 140  K 3.9  CL 99  CO2 25  GLUCOSE 89  BUN 12  CREATININE 0.92  CALCIUM 9.3   Cardiac Enzymes  Recent Labs  06/23/14 0032 06/23/14 0535  TROPONINI <0.30 <0.30   Thyroid Function Tests  Recent Labs  06/23/14 0032  TSH 0.179*    TELE  A-fib rate controlled, HR 100 yesterday, trended down to 60s overnight. No significant  ventricular ectopy  ECG  A-fib with HR 70s  Radiology/Studies  No results found.  ASSESSMENT AND PLAN  1. New onset a-fib with RVR  - CHADSVASC = 0  - per Dr. Shirlee Mcbride, given 300mg  flecainide, fail to convert.   - TSH low, free T4 pending, possible hyperthyroidism  - waiting for free T4, if high, will consult IM.   - pending echo and urine drug test  - if induced by hyperthyroidism, unclear benefit of electrical cardioversion at this point before hyperthyroidism under control, will discuss with Dr. Antoine Mcbride   Signed, Bernard Course PA-C Pager: [phone]   History and all data above reviewed.  Patient examined.  I agree with the findings as above.  Since the above note he has converted to NSR. He denies any symptoms.  The patient exam reveals COR:RRR  ,  Lungs: Clear  ,  Abd: Positive bowel sounds, no rebound no guarding, Ext No edema  .  All available labs, radiology testing, previous records reviewed. Agree with documented assessment and plan.  OK to discharge.  Send home on low dose beta blocker metoprolol IR 25 bid.  No indication for anticoagulation.  He will need an outpatient endocrinology appt.  Follow up within the month in our clinic with APP.  Bernard Mcbride Bernard Mcbride  9:26 AM  06/23/2014

## 2014-06-23 NOTE — Progress Notes (Signed)
ANTICOAGULATION CONSULT NOTE - Follow Up Consult  Pharmacy Consult for heparin Indication: atrial fibrillation  Labs:  Recent Labs  06/22/14 1856 06/23/14 0032 06/23/14 0332  HGB 18.3* 16.8  --   HCT 50.2 47.7  --   PLT 191 184  --   HEPARINUNFRC  --  0.33 0.22*  CREATININE 0.92  --   --   TROPONINI  --  <0.30  --     Assessment: 28yo male nowsubtherapeutic on heparin after one level at low end of goal and despite rate increase, though lab was again drawn early just a few minutes after rate change.  Goal of Therapy:  Heparin level 0.3-0.7 units/ml   Plan:  Will increase heparin gtt by 3 units/kg/hr to 1800 units/hr and check level in 6hr.  Bernard GamblesVeronda Loden Mcbride, PharmD, BCPS  06/23/2014,4:44 AM

## 2014-06-23 NOTE — Discharge Instructions (Signed)
Atrial Fibrillation  Atrial fibrillation is a condition that causes your heart to beat irregularly. It may also cause your heart to beat faster than normal. Atrial fibrillation can prevent your heart from pumping blood normally. It increases your risk of stroke and heart problems.  HOME CARE  · Take medications as told by your doctor.  · Only take medications that your doctor says are safe. Some medications can make the condition worse or happen again.  · If blood thinners were prescribed by your doctor, take them exactly as told. Too much can cause bleeding. Too little and you will not have the needed protection against stroke and other problems.  · Perform blood tests at home if told by your doctor.  · Perform blood tests exactly as told by your doctor.  · Do not drink alcohol.  · Do not drink beverages with caffeine such as coffee, soda, and some teas.  · Maintain a healthy weight.  · Do not use diet pills unless your doctor says they are safe. They may make heart problems worse.  · Follow diet instructions as told by your doctor.  · Exercise regularly as told by your doctor.  · Keep all follow-up appointments.  GET HELP RIGHT AWAY IF:   · You have chest or belly (abdominal) pain.  · You feel sick to your stomach (nauseous)  · You suddenly have swollen feet and ankles.  · You feel dizzy.  · You face, arms, or legs feel numb or weak.  · There is a change in your vision or speech.  · You notice a change in the speed, rhythm, or strength of your heartbeat.  · You suddenly begin peeing (urinating) more often.  · You get tired more easily when moving or exercising.  MAKE SURE YOU:   · Understand these instructions.  · Will watch your condition.  · Will get help right away if you are not doing well or get worse.  Document Released: 08/29/2008 Document Revised: 03/17/2013 Document Reviewed: 12/31/2012  ExitCare® Patient Information ©2015 [employer]. This information is not intended to replace advice given to you by  your health care provider. Make sure you discuss any questions you have with your health care provider.

## 2014-06-24 LAB — DRUGS OF ABUSE SCREEN W/O ALC, ROUTINE URINE
Amphetamine Screen, Ur: NEGATIVE
BARBITURATE QUANT UR: NEGATIVE
BENZODIAZEPINES.: NEGATIVE
Cocaine Metabolites: NEGATIVE
Creatinine,U: 143.3 mg/dL
MARIJUANA METABOLITE: POSITIVE — AB
METHADONE: NEGATIVE
Opiate Screen, Urine: NEGATIVE
Phencyclidine (PCP): NEGATIVE
Propoxyphene: NEGATIVE

## 2014-06-27 LAB — THC (MARIJUANA), URINE, CONFIRMATION: MARIJUANA, UR-CONFIRMATION: 579 ng/mL — AB (ref <5)

## 2014-07-17 ENCOUNTER — Other Ambulatory Visit (HOSPITAL_COMMUNITY): Payer: Self-pay | Admitting: Cardiology

## 2014-07-17 DIAGNOSIS — I4891 Unspecified atrial fibrillation: Secondary | ICD-10-CM

## 2014-07-28 ENCOUNTER — Other Ambulatory Visit (HOSPITAL_COMMUNITY): Payer: BC Managed Care – PPO

## 2014-07-29 ENCOUNTER — Encounter (HOSPITAL_COMMUNITY): Payer: Self-pay | Admitting: Physician Assistant

## 2015-02-15 ENCOUNTER — Ambulatory Visit (INDEPENDENT_AMBULATORY_CARE_PROVIDER_SITE_OTHER): Payer: Managed Care, Other (non HMO) | Admitting: Family Medicine

## 2015-02-15 ENCOUNTER — Other Ambulatory Visit: Payer: Self-pay | Admitting: Family Medicine

## 2015-02-15 VITALS — BP 134/80 | HR 102 | Temp 101.1°F | Resp 17 | Ht 73.0 in | Wt 190.0 lb

## 2015-02-15 DIAGNOSIS — R1084 Generalized abdominal pain: Secondary | ICD-10-CM | POA: Diagnosis not present

## 2015-02-15 DIAGNOSIS — R000 Tachycardia, unspecified: Secondary | ICD-10-CM

## 2015-02-15 DIAGNOSIS — R197 Diarrhea, unspecified: Secondary | ICD-10-CM

## 2015-02-15 DIAGNOSIS — Z1329 Encounter for screening for other suspected endocrine disorder: Secondary | ICD-10-CM | POA: Diagnosis not present

## 2015-02-15 DIAGNOSIS — R509 Fever, unspecified: Secondary | ICD-10-CM

## 2015-02-15 DIAGNOSIS — R112 Nausea with vomiting, unspecified: Secondary | ICD-10-CM | POA: Diagnosis not present

## 2015-02-15 DIAGNOSIS — Z8349 Family history of other endocrine, nutritional and metabolic diseases: Secondary | ICD-10-CM

## 2015-02-15 DIAGNOSIS — R7989 Other specified abnormal findings of blood chemistry: Secondary | ICD-10-CM

## 2015-02-15 LAB — COMPREHENSIVE METABOLIC PANEL
ALBUMIN: 4.2 g/dL (ref 3.5–5.2)
ALT: 19 U/L (ref 0–53)
AST: 16 U/L (ref 0–37)
Alkaline Phosphatase: 66 U/L (ref 39–117)
BUN: 11 mg/dL (ref 6–23)
CALCIUM: 9.1 mg/dL (ref 8.4–10.5)
CHLORIDE: 102 meq/L (ref 96–112)
CO2: 24 mEq/L (ref 19–32)
CREATININE: 0.94 mg/dL (ref 0.50–1.35)
GLUCOSE: 101 mg/dL — AB (ref 70–99)
POTASSIUM: 4.2 meq/L (ref 3.5–5.3)
Sodium: 141 mEq/L (ref 135–145)
Total Bilirubin: 0.6 mg/dL (ref 0.2–1.2)
Total Protein: 6.4 g/dL (ref 6.0–8.3)

## 2015-02-15 LAB — POCT CBC
GRANULOCYTE PERCENT: 96.4 % — AB (ref 37–80)
HEMATOCRIT: 51.8 % (ref 43.5–53.7)
HEMOGLOBIN: 17.1 g/dL (ref 14.1–18.1)
Lymph, poc: 0.2 — AB (ref 0.6–3.4)
MCH, POC: 29.2 pg (ref 27–31.2)
MCHC: 33 g/dL (ref 31.8–35.4)
MCV: 88.6 fL (ref 80–97)
MID (cbc): 0.1 (ref 0–0.9)
MPV: 8.7 fL (ref 0–99.8)
POC GRANULOCYTE: 8.7 — AB (ref 2–6.9)
POC LYMPH PERCENT: 2.7 %L — AB (ref 10–50)
POC MID %: 0.9 %M (ref 0–12)
Platelet Count, POC: 171 10*3/uL (ref 142–424)
RBC: 5.84 M/uL (ref 4.69–6.13)
RDW, POC: 14.3 %
WBC: 9 10*3/uL (ref 4.6–10.2)

## 2015-02-15 LAB — POCT URINALYSIS DIPSTICK
Blood, UA: NEGATIVE
Glucose, UA: NEGATIVE
LEUKOCYTES UA: NEGATIVE
NITRITE UA: NEGATIVE
PH UA: 6.5
Protein, UA: 30
Spec Grav, UA: 1.02
UROBILINOGEN UA: 0.2

## 2015-02-15 LAB — LIPASE: Lipase: 21 U/L (ref 0–75)

## 2015-02-15 LAB — GLUCOSE, POCT (MANUAL RESULT ENTRY): POC Glucose: 101 mg/dl — AB (ref 70–99)

## 2015-02-15 LAB — TSH: TSH: 0.247 u[IU]/mL — ABNORMAL LOW (ref 0.350–4.500)

## 2015-02-15 MED ORDER — IBUPROFEN 200 MG PO TABS: 400.0000 mg | ORAL_TABLET | Freq: Once | ORAL | Status: AC

## 2015-02-15 MED ORDER — ONDANSETRON 4 MG PO TBDP
8.0000 mg | ORAL_TABLET | Freq: Once | ORAL | Status: AC
  Administered 2015-02-15: 8 mg via ORAL

## 2015-02-15 MED ORDER — ONDANSETRON 4 MG PO TBDP: ORAL_TABLET | ORAL | Status: DC

## 2015-02-15 NOTE — Patient Instructions (Signed)
I will give you a call with the rest of your labs asap.  Rest and drink fluids. If you feel like eating stick with easy to digest foods such as saltines, applesauce, bananas You can use the zofran as needed for nausea Let me know if you are not feeling better in the next couple of days  Use ibuprofen/ tylenol as needed for fever.

## 2015-02-15 NOTE — Progress Notes (Addendum)
[facility] [address], [place] [address] [phone]  Date:  02/15/2015   Name:  Bernard Mcbride   DOB:  Mar 22, 1986   MRN:  [id number]  PCP:  Pcp Not In System    Chief Complaint: Emesis; Flank Pain; and Dizziness   History of Present Illness:  Bernard Mcbride is a 29 y.o. very pleasant male patient who presents with the following:  Here today with illness.  He got sick last night with vomiting and diarrhea, this started in the middle of the night.  He is not able to keep down water or anythigngelse, he is afraid that he is getting dehydrated.  Last summer he had a similar illness, got into a fib with RVR.  He self- converted back to sinus and was discharged on metoprolol.  He was also found to have a supressed TSH at that time.  However, he is no longer taking his metoprolol and did not continue to follow-up with endocrinology.    3 years ago when he was again sick with N/V/D he had ARF with creat up to about 2.5.   He is not aware of any suspicious foods, no sick contacts   No blood in stool, a little in his vomit.   He is engaged, smoker, light drinker  Patient Active Problem List   Diagnosis Date Noted  . Atrial fibrillation, unspecified 06/22/2014  . Atrial fibrillation 06/22/2014  . AKI (acute kidney injury) 07/01/2013  . Hematemesis 06/30/2013  . Asthma, mild persistent 06/30/2013  . Seasonal allergic rhinitis 06/23/2013  . GERD (gastroesophageal reflux disease) 06/23/2013  . Chronic low back pain 06/23/2013    Past Medical History  Diagnosis Date  . Cellulitis     recurrent; MRSA; pre-patella, lip  . Asthma     seasonal  . Barrett's esophagus   . Colon polyp, hyperplastic   . GERD (gastroesophageal reflux disease)   . PAF (paroxysmal atrial fibrillation)     a. 06/22/14 in the setting of elevated tsh, converted back to NSR after  fleicanide. No anticoagulation with CHA2DS-VAsc score of 0.   . Elevated TSH     Refered to  endocrinologist, Dr. Talmage Coin    History reviewed. No pertinent past surgical history.  History  Substance Use Topics  . Smoking status: Current Every Day Smoker -- 0.25 packs/day for 10 years    Types: Cigarettes    Last Attempt to Quit: 11/15/2013  . Smokeless tobacco: Former Neurosurgeon    Types: Snuff     Comment: working on quitting, down to <0.5 ppd  . Alcohol Use: 0.6 oz/week    1 Cans of beer per week    Family History  Problem Relation Age of Onset  . Diabetes Mother   . Hypertension Father   . Breast cancer    . Bladder Cancer    . Prostate cancer    . Heart attack    . Diabetes    . Hypertension      No Known Allergies  Medication list has been reviewed and updated.  Current Outpatient Prescriptions on File Prior to Visit  Medication Sig Dispense Refill  . albuterol (PROVENTIL HFA;VENTOLIN HFA) 108 (90 BASE) MCG/ACT inhaler Inhale 2 puffs into the lungs every 4 (four) hours as needed for wheezing (cough, shortness of breath or wheezing.). 1 Inhaler 1  . beclomethasone (QVAR) 80 MCG/ACT inhaler Inhale 2 puffs into the lungs 2 (two) times daily. 1 Inhaler 1  . calcium  carbonate (TUMS - DOSED IN MG ELEMENTAL CALCIUM) 500 MG chewable tablet Chew 1-2 tablets by mouth daily as needed for indigestion or heartburn.    . esomeprazole (NEXIUM) 40 MG packet Take 40 mg by mouth daily before breakfast. (Patient not taking: Reported on 02/15/2015) 30 each 12  . metoprolol tartrate (LOPRESSOR) 25 MG tablet Take 1 tablet (25 mg total) by mouth 2 (two) times daily. (Patient not taking: Reported on 02/15/2015) 60 tablet 6  . traMADol (ULTRAM) 50 MG tablet Take 50 mg by mouth every 6 (six) hours as needed for moderate pain.     No current facility-administered medications on file prior to visit.    Review of Systems:  As per HPI- otherwise negative.   Physical Examination: Filed Vitals:   02/15/15 1048  BP: 134/80  Pulse: 111  Temp: 99.3 F (37.4 C)  Resp: 17   Filed  Vitals:   02/15/15 1048  Height:  (1.854 m)  Weight: 190 lb (86.183 kg)   Body mass index is 25.07 kg/(m^2). Ideal Body Weight: Weight in (lb) to have BMI = 25: 189.1  GEN: WDWN, NAD, Non-toxic, A & O x 3.  Looks well but is febrile HEENT: Atraumatic, Normocephalic. Neck supple. No masses, No LAD. Ears and Nose: No external deformity. CV: RRR. tachycardic, No M/G/R. No JVD. No thrill. No extra heart sounds. PULM: CTA B, no wheezes, crackles, rhonchi. No retractions. No resp. distress. No accessory muscle use. ABD: S,ND, +BS. No rebound. No HSM.  He has mild tenderness over bilateral upper oblique muscles which he attributes to vomiting.  No lower abd tenderness. No RLQ tenderness EXTR: No c/c/e NEURO Normal gait.  PSYCH: Normally interactive. Conversant. Not depressed or anxious appearing.  Calm demeanor.   Results for orders placed or performed in visit on 02/15/15  POCT CBC  Result Value Ref Range   WBC 9.0 4.6 - 10.2 K/uL   Lymph, poc 0.2 (A) 0.6 - 3.4   POC LYMPH PERCENT 2.7 (A) 10 - 50 %L   MID (cbc) 0.1 0 - 0.9   POC MID % 0.9 0 - 12 %M   POC Granulocyte 8.7 (A) 2 - 6.9   Granulocyte percent 96.4 (A) 37 - 80 %G   RBC 5.84 4.69 - 6.13 M/uL   Hemoglobin 17.1 14.1 - 18.1 g/dL   HCT, POC 16.1 09.6 - 53.7 %   MCV 88.6 80 - 97 fL   MCH, POC 29.2 27 - 31.2 pg   MCHC 33.0 31.8 - 35.4 g/dL   RDW, POC 04.5 %   Platelet Count, POC 171 142 - 424 K/uL   MPV 8.7 0 - 99.8 fL  POCT glucose (manual entry)  Result Value Ref Range   POC Glucose 101 (A) 70 - 99 mg/dl  POCT urinalysis dipstick  Result Value Ref Range   Color, UA yellow    Clarity, UA clear    Glucose, UA neg    Bilirubin, UA small    Ketones, UA trace    Spec Grav, UA 1.020    Blood, UA neg    pH, UA 6.5    Protein, UA 30    Urobilinogen, UA 0.2    Nitrite, UA neg    Leukocytes, UA Negative     EKG:  Mild sinus tach with rate of 102.  He is not in a fib again He was hydrated with 2L of NS, given  of  zofran and 400 ibuprofen, felt better  Assessment  and Plan: Non-intractable vomiting with nausea, vomiting of unspecified type - Plan: ondansetron (ZOFRAN-ODT) disintegrating tablet 8 mg  Diarrhea - Plan: POCT glucose (manual entry), POCT urinalysis dipstick, Comprehensive metabolic panel  Generalized abdominal pain - Plan: POCT CBC, Lipase  Family history of thyroid disorder  Thyroid disorder screen - Plan: TSH  Tachycardia - Plan: EKG 12-Lead  Fever, unspecified fever cause - Plan: ibuprofen (ADVIL,MOTRIN) tablet 400 mg  Here today with likely viral gastroenteritis.  Hydrated as above, zofran to control nausea.  No evidence of recurrent arrhythmia, will recheck his TSH.  CMP and lipase pending. Will call him pending these results  He is asked to follow-up if he is getting worse or not better within 2 days  Signed Abbe AmsterdamJessica Ellina Sivertsen, MD  Called him on 3/16: fever is gone, he is able to eat a light diet, abd pain is ok today.  He still has some flank pain.  Advised that his TSH is slightly suppressed but his T3 and T4 are normal.  Will refer to endocrinology for further evaluation.  For the time being will not further treat his thyroid as his TSH is not highly suppressed and he is feeling better   '

## 2015-02-17 LAB — T4, FREE: FREE T4: 1.07 ng/dL (ref 0.80–1.80)

## 2015-02-17 LAB — T3, FREE: T3 FREE: 2.5 pg/mL (ref 2.3–4.2)

## 2015-02-17 NOTE — Addendum Note (Signed)
Addended by: Abbe AmsterdamOPLAND, JESSICA C on: 02/17/2015 05:54 PM   Modules accepted: Orders

## 2015-02-23 ENCOUNTER — Ambulatory Visit (INDEPENDENT_AMBULATORY_CARE_PROVIDER_SITE_OTHER): Payer: Managed Care, Other (non HMO) | Admitting: Endocrinology

## 2015-02-23 ENCOUNTER — Encounter: Payer: Self-pay | Admitting: Endocrinology

## 2015-02-23 VITALS — BP 126/80 | HR 80 | Temp 97.9°F | Ht 73.0 in | Wt 190.0 lb

## 2015-02-23 DIAGNOSIS — E0590 Thyrotoxicosis, unspecified without thyrotoxic crisis or storm: Secondary | ICD-10-CM | POA: Diagnosis not present

## 2015-02-23 MED ORDER — METHIMAZOLE 5 MG PO TABS: 5.0000 mg | ORAL_TABLET | ORAL | Status: DC

## 2015-02-23 NOTE — Progress Notes (Signed)
Subjective:    Patient ID: Bernard Mcbride, male    DOB: 07-14-1986, 29 y.o.   MRN: [id number]  HPI Hx is from pt and his mother.  he was dx'ed with mild hyperthyroidism in 2015.  He has never been on therapy for this.  He has never had XRT to the anterior neck, or thyroid surgery.  He has never had thyroid imaging.  He does not consume kelp or any other prescribed or non-prescribed thyroid medication.  He has been noted to have intermittent AF, but has never been on amiodarone.  He has slight pain at the lower back, but no assoc numbness.   Past Medical History  Diagnosis Date  . Cellulitis     recurrent; MRSA; pre-patella, lip  . Asthma     seasonal  . Barrett's esophagus   . Colon polyp, hyperplastic   . GERD (gastroesophageal reflux disease)   . PAF (paroxysmal atrial fibrillation)     a. 06/22/14 in the setting of elevated tsh, converted back to NSR after 300mg  fleicanide. No anticoagulation with CHA2DS-VAsc score of 0.   . Elevated TSH     Refered to endocrinologist, Dr. Talmage CoinJeffrey Kerr    No past surgical history on file.  History   Social History  . Marital Status: Single    Spouse Name: engaged  . Number of Children: 0  . Years of Education: Assoc.   Occupational History  . Landscaping    Social History Main Topics  . Smoking status: Current Every Day Smoker -- 0.25 packs/day for 10 years    Types: Cigarettes    Last Attempt to Quit: 11/15/2013  . Smokeless tobacco: Former NeurosurgeonUser    Types: Snuff     Comment: working on quitting, down to <0.5 ppd  . Alcohol Use: 0.6 oz/week    1 Cans of beer per week  . Drug Use: No  . Sexual Activity: Not on file   Other Topics Concern  . Not on file   Social History Narrative   Lives with his fiance.    Current Outpatient Prescriptions on File Prior to Visit  Medication Sig Dispense Refill  . albuterol (PROVENTIL HFA;VENTOLIN HFA) 108 (90 BASE) MCG/ACT inhaler Inhale 2 puffs into the lungs every 4 (four) hours as needed  for wheezing (cough, shortness of breath or wheezing.). 1 Inhaler 1  . beclomethasone (QVAR) 80 MCG/ACT inhaler Inhale 2 puffs into the lungs 2 (two) times daily. 1 Inhaler 1  . calcium carbonate (TUMS - DOSED IN MG ELEMENTAL CALCIUM) 500 MG chewable tablet Chew 1-2 tablets by mouth daily as needed for indigestion or heartburn.    . esomeprazole (NEXIUM) 40 MG packet Take 40 mg by mouth daily before breakfast. 30 each 12  . metoprolol tartrate (LOPRESSOR) 25 MG tablet Take 1 tablet (25 mg total) by mouth 2 (two) times daily. (Patient not taking: Reported on 02/15/2015) 60 tablet 6  . ondansetron (ZOFRAN-ODT) 4 MG disintegrating tablet Take 1 or 2 every 8 hours as needed for nausea (Patient not taking: Reported on 02/23/2015) 20 tablet 0  . traMADol (ULTRAM) 50 MG tablet Take 50 mg by mouth every 6 (six) hours as needed for moderate pain.     Current Facility-Administered Medications on File Prior to Visit  Medication Dose Route Frequency Provider Last Rate Last Dose  . ibuprofen (ADVIL,MOTRIN) tablet 400 mg  400 mg Oral Once Pearline CablesJessica C Copland, MD        No Known Allergies  Family History  Problem Relation Age of Onset  . Diabetes Mother   . Hypertension Father   . Breast cancer    . Bladder Cancer    . Prostate cancer    . Heart attack    . Diabetes    . Hypertension    . Thyroid disease Mother     BP 126/80 mmHg  Pulse 80  Temp(Src) 97.9 F (36.6 C) (Oral)  Ht  (1.854 m)  Wt 190 lb (86.183 kg)  BMI 25.07 kg/m2  SpO2 97%  Review of Systems denies weight loss, headache, hoarseness, double vision, palpitations, sob, diarrhea, polyuria, excessive diaphoresis, tremor, anxiety, heat intolerance, easy bruising, and rhinorrhea.  He has slight myalgias     Objective:   Physical Exam VS: see vs page GEN: no distress HEAD: head: no deformity eyes: no periorbital swelling, no proptosis external nose and ears are normal mouth: no lesion seen NECK: supple, thyroid is not  enlarged CHEST WALL: no deformity LUNGS: clear to auscultation BREASTS:  No gynecomastia CV: reg rate and rhythm, no murmur ABD: abdomen is soft, nontender.  no hepatosplenomegaly.  not distended.  no hernia.   MUSCULOSKELETAL: muscle bulk and strength are grossly normal.  no obvious joint swelling.  gait is normal and steady EXTEMITIES: no deformity.  no edema.  NEURO:  cn 2-12 grossly intact.   readily moves all 4's.  sensation is intact to touch on all 4's.  No tremor SKIN:  Normal texture and temperature.  No rash or suspicious lesion is visible.  Not diaphoretic NODES:  None palpable at the neck.   PSYCH: alert, well-oriented.  Does not appear anxious nor depressed.     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  However, imaging would not change management. AF: new.  well-controlled.  In this context, he should have normalization of TSH.   Patient is advised the following: Patient Instructions  i have sent a prescription to your pharmacy, to slow the thyroid.  if ever you have fever while taking methimazole, stop it and call us, because of the risk of a rare side-effect Please come back for a follow-up appointment in 1 month.

## 2015-02-23 NOTE — Patient Instructions (Signed)
i have sent a prescription to your pharmacy, to slow the thyroid.  if ever you have fever while taking methimazole, stop it and call us, because of the risk of a rare side-effect Please come back for a follow-up appointment in 1 month.

## 2015-02-25 DIAGNOSIS — E0590 Thyrotoxicosis, unspecified without thyrotoxic crisis or storm: Secondary | ICD-10-CM | POA: Insufficient documentation

## 2015-03-26 ENCOUNTER — Ambulatory Visit: Payer: Managed Care, Other (non HMO) | Admitting: Endocrinology

## 2015-03-26 ENCOUNTER — Telehealth: Payer: Self-pay | Admitting: Endocrinology

## 2015-03-26 DIAGNOSIS — Z0289 Encounter for other administrative examinations: Secondary | ICD-10-CM

## 2015-03-26 IMAGING — CR DG CHEST 2V
3 series · 3 of 3 positions shown · non-contrast
Comparison: None.

CLINICAL DATA: Wheezing.  Coughing.  Shortness of breath.

CHEST - 2 VIEW

[PA]
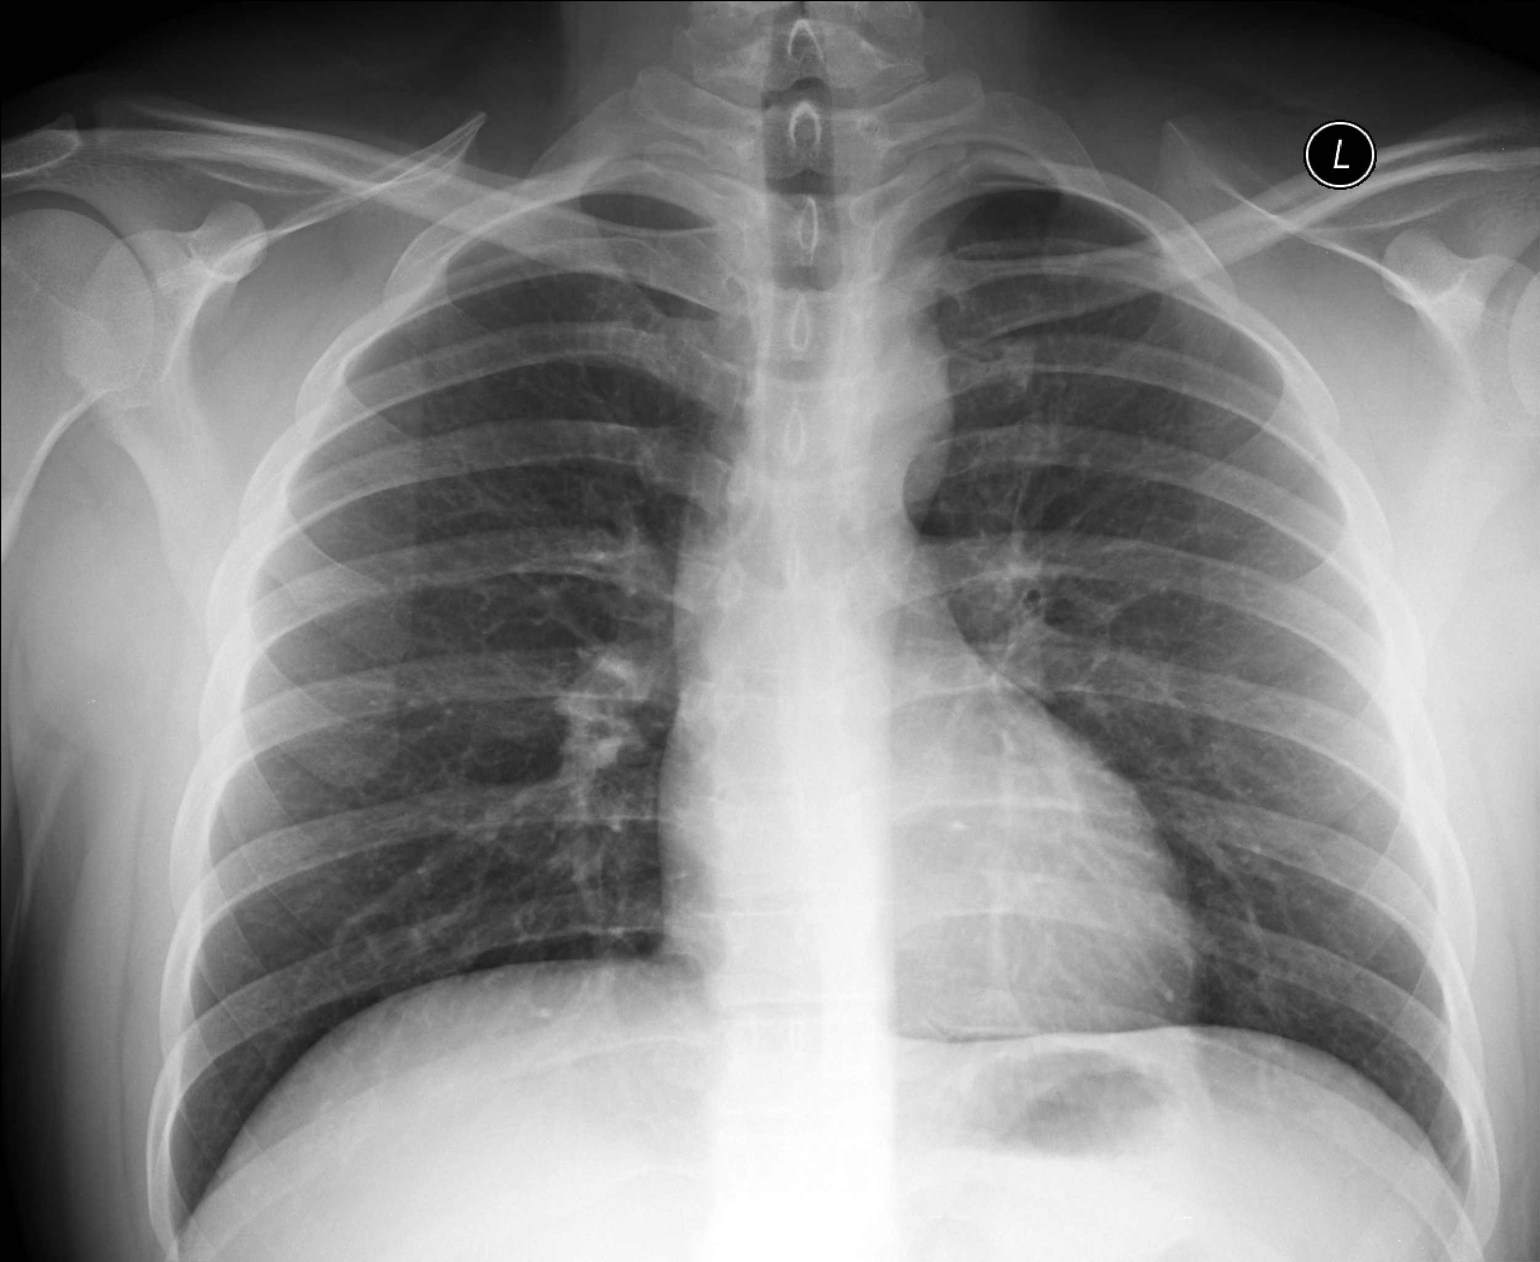

[lateral (1 of 2)]
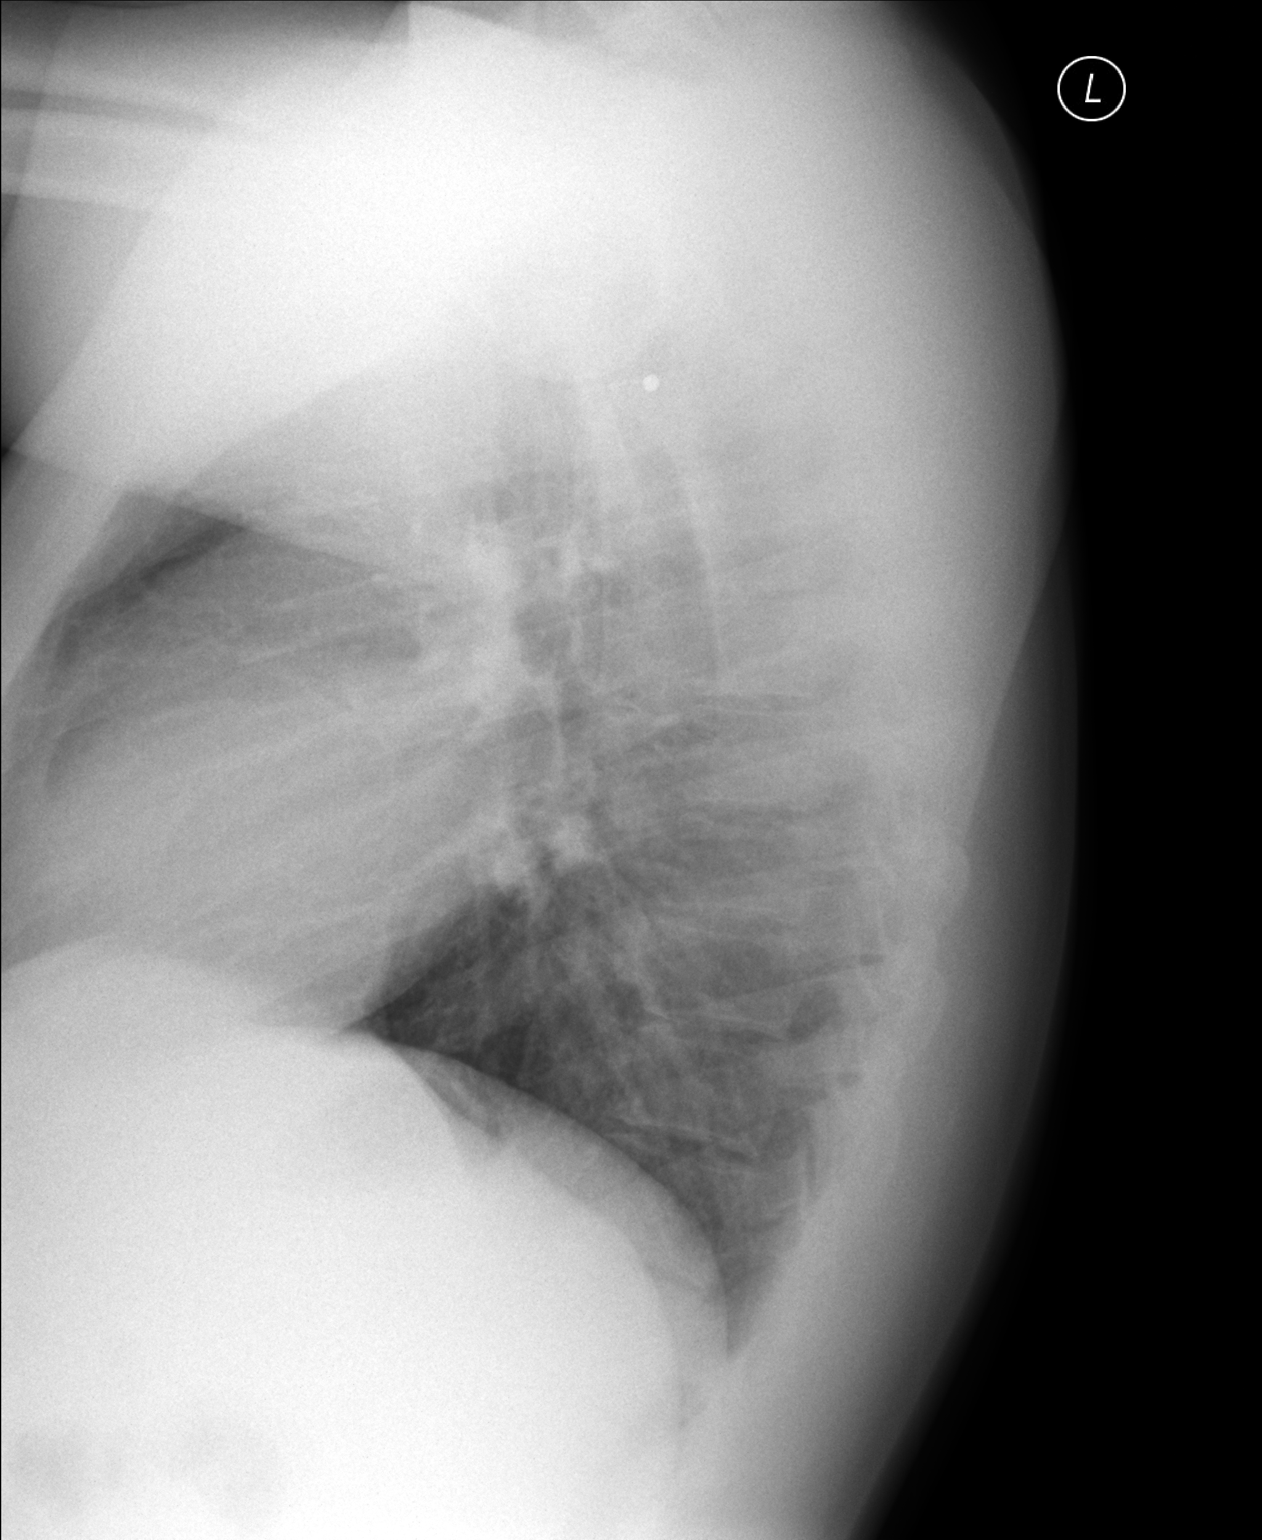

[lateral (2 of 2)]
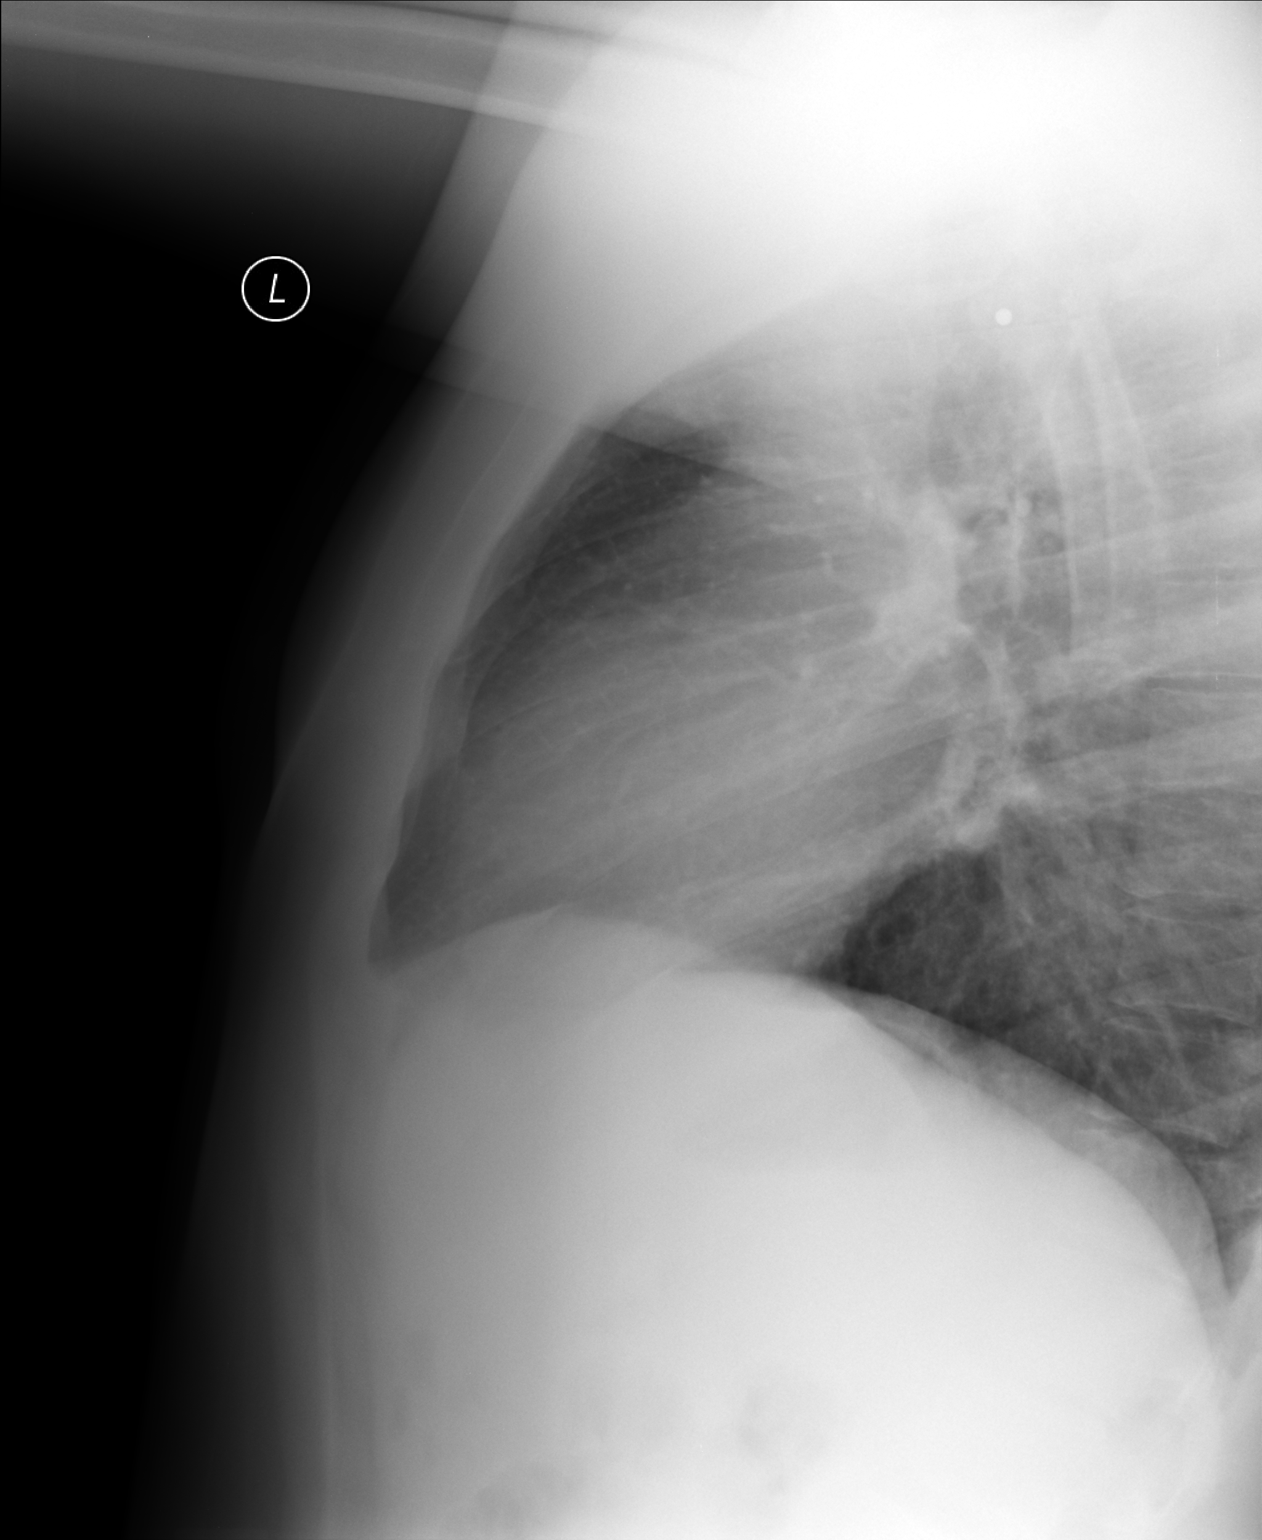

[3 of 3 positions shown; findings below may reference images not displayed]

FINDINGS: Cardiac silhouette is normal size and shape.  No
pulmonary infiltrates or masses are seen.  No hilar enlargement is
evident. No pleural abnormality is evident. Bones appear average
for age.
IMPRESSION: No acute or active cardiopulmonary or pleural abnormalities are
identified.

Clinically significant discrepancy from primary report, if
provided: None

## 2015-03-26 NOTE — Telephone Encounter (Signed)
Patient no showed today's appt. Please advise on how to follow up. °A. No follow up necessary. °B. Follow up urgent. Contact patient immediately. °C. Follow up necessary. Contact patient and schedule visit in ___ days. °D. Follow up advised. Contact patient and schedule visit in ____weeks. ° °

## 2015-03-26 NOTE — Telephone Encounter (Signed)
Please come back for a follow-up appointment in 2 weeks 

## 2015-03-29 NOTE — Telephone Encounter (Signed)
I tried to contact the patient. Unable to reach the patient or leave a voicemail (mailbox was not set up). Follow up appointment letter mailed to the patient.

## 2015-04-01 IMAGING — CR DG CHEST 2V
2 series · 2 of 2 positions shown · non-contrast
Comparison: 06/24/2013.

CLINICAL DATA: Shortness of breath.

CHEST - 2 VIEW

[w chest pa]
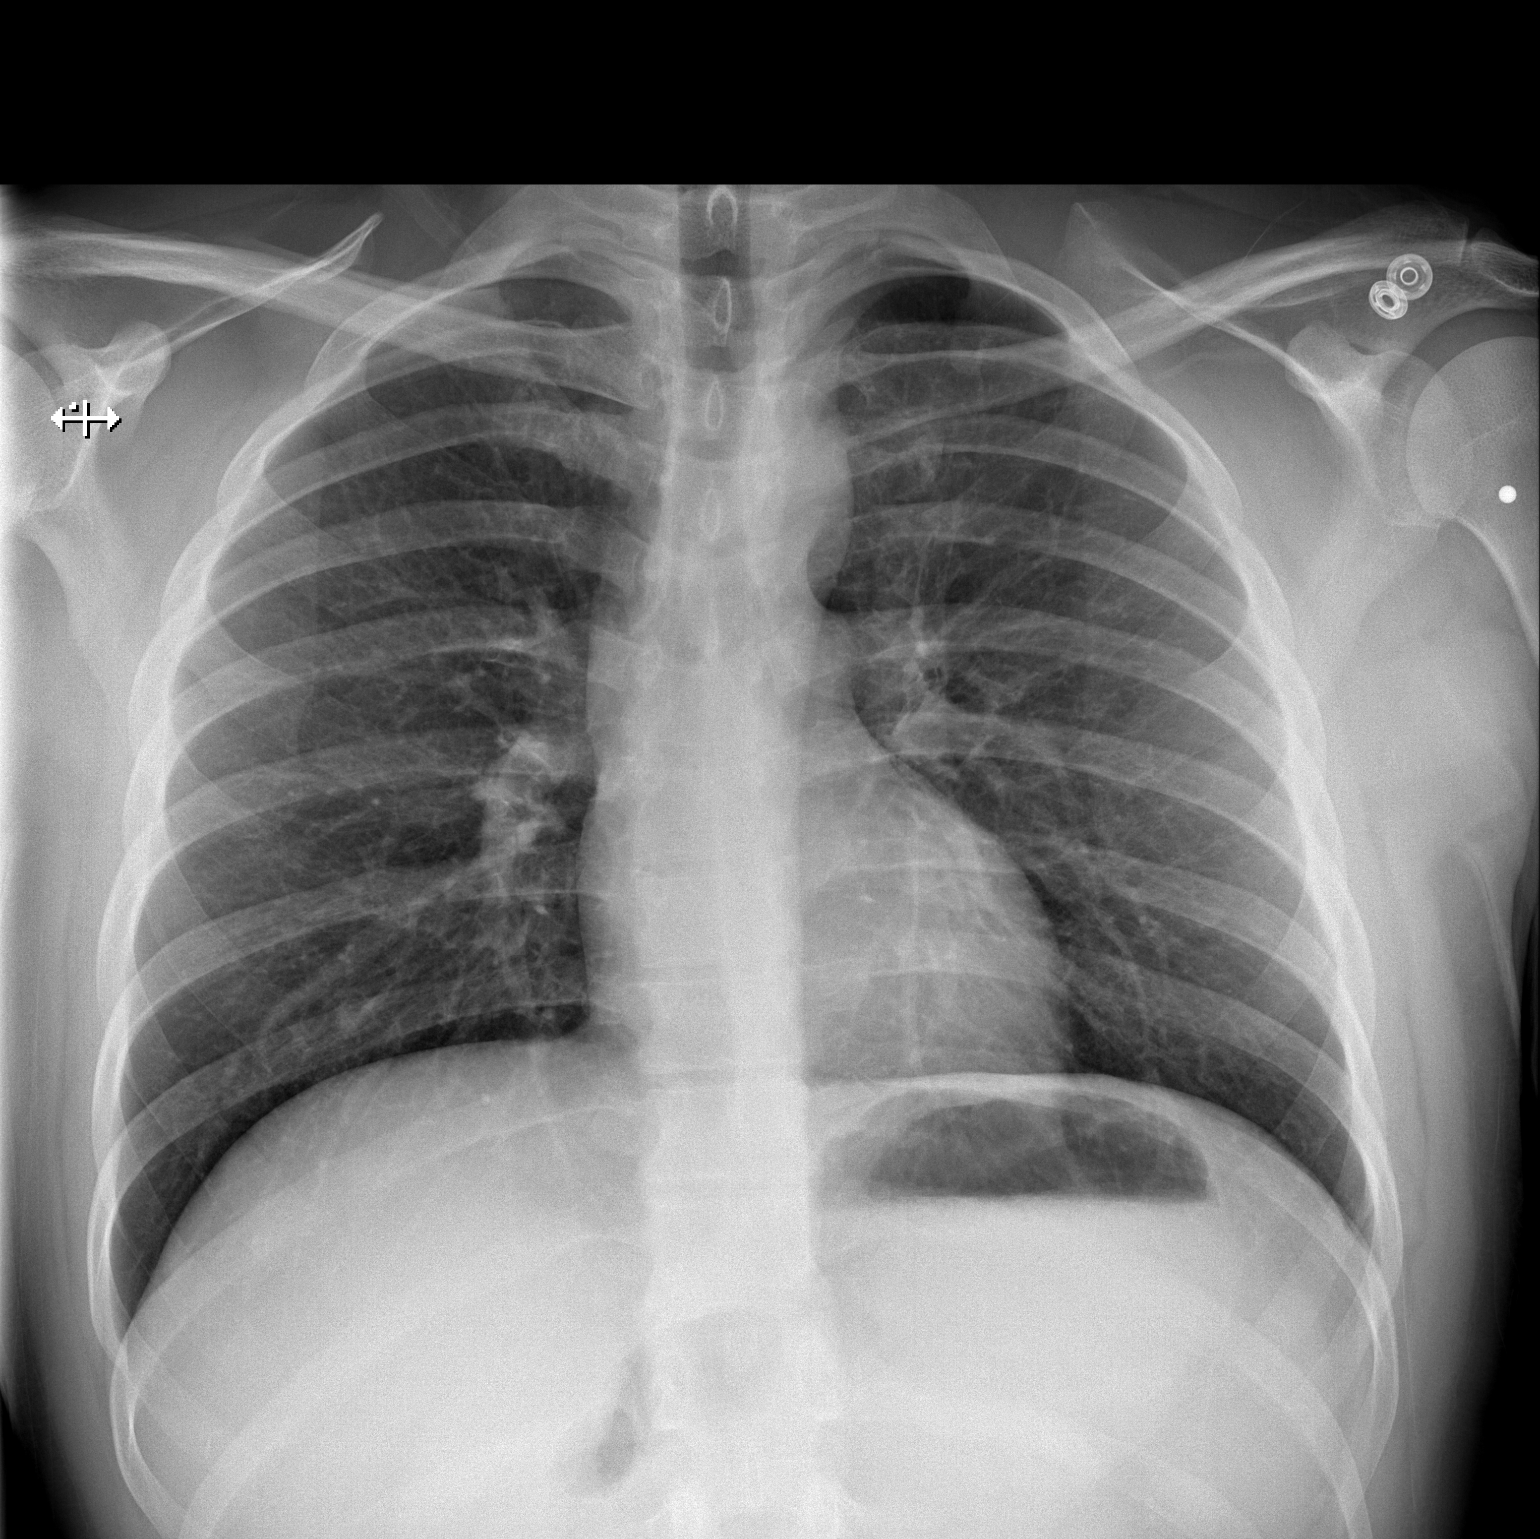

[w chest lat]
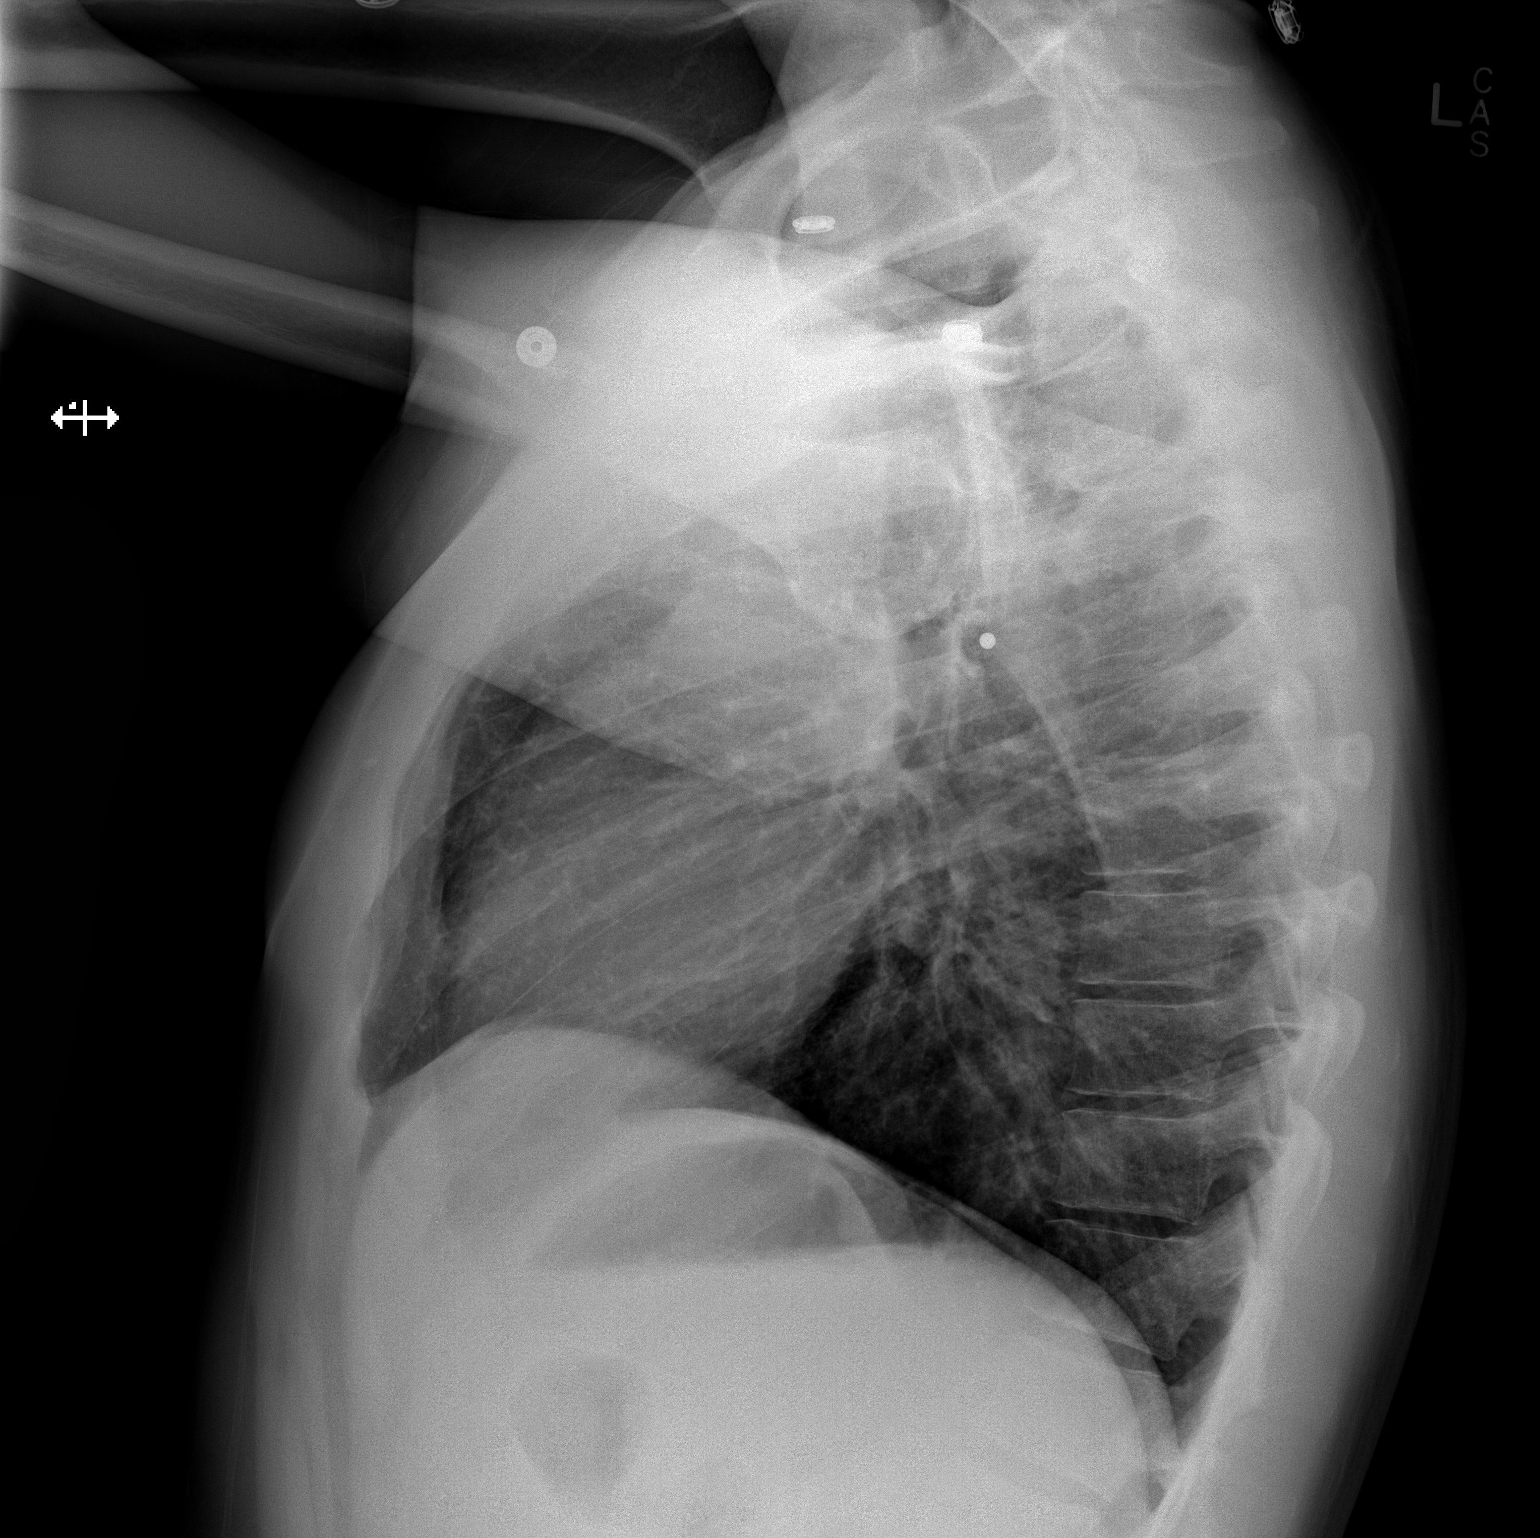

[2 of 2 positions shown; findings below may reference images not displayed]

FINDINGS: No cardiomegaly.  Aortic arch is somewhat high in its
appearance, but there is no evident kinking or enlargement.

Lungs clear well-aerated.  No effusion or pneumothorax.

Negative osseous structures.
IMPRESSION: No evidence of acute cardiopulmonary disease.

## 2015-08-10 ENCOUNTER — Ambulatory Visit (INDEPENDENT_AMBULATORY_CARE_PROVIDER_SITE_OTHER): Payer: Managed Care, Other (non HMO) | Admitting: Family Medicine

## 2015-08-10 ENCOUNTER — Telehealth: Payer: Self-pay

## 2015-08-10 ENCOUNTER — Inpatient Hospital Stay: Admission: RE | Admit: 2015-08-10 | Payer: Managed Care, Other (non HMO) | Source: Ambulatory Visit

## 2015-08-10 VITALS — BP 100/60 | HR 77 | Temp 98.1°F | Resp 16 | Ht 72.5 in | Wt 183.0 lb

## 2015-08-10 DIAGNOSIS — M79632 Pain in left forearm: Secondary | ICD-10-CM | POA: Diagnosis not present

## 2015-08-10 DIAGNOSIS — M7989 Other specified soft tissue disorders: Secondary | ICD-10-CM

## 2015-08-10 DIAGNOSIS — L03114 Cellulitis of left upper limb: Secondary | ICD-10-CM

## 2015-08-10 LAB — POCT CBC
GRANULOCYTE PERCENT: 65.5 % (ref 37–80)
HCT, POC: 44.8 % (ref 43.5–53.7)
HEMOGLOBIN: 14.8 g/dL (ref 14.1–18.1)
Lymph, poc: 1.7 (ref 0.6–3.4)
MCH: 27.7 pg (ref 27–31.2)
MCHC: 33 g/dL (ref 31.8–35.4)
MCV: 83.9 fL (ref 80–97)
MID (cbc): 0.7 (ref 0–0.9)
MPV: 7.8 fL (ref 0–99.8)
PLATELET COUNT, POC: 212 10*3/uL (ref 142–424)
POC Granulocyte: 4.5 (ref 2–6.9)
POC LYMPH PERCENT: 24.6 %L (ref 10–50)
POC MID %: 9.9 %M (ref 0–12)
RBC: 5.35 M/uL (ref 4.69–6.13)
RDW, POC: 13.6 %
WBC: 6.8 10*3/uL (ref 4.6–10.2)

## 2015-08-10 MED ORDER — SULFAMETHOXAZOLE-TRIMETHOPRIM 800-160 MG PO TABS: 1.0000 | ORAL_TABLET | Freq: Two times a day (BID) | ORAL | Status: DC

## 2015-08-10 NOTE — Telephone Encounter (Signed)
Patient states he missed a call from Dr. Patsy Lager this evening regarding a CT. He had to cancel the appt scheduled for today at 1720 and needs to reschedule. He tried calling [facility] but was unable to get anyone on the phone. He stayed on the line for about 10 minutes. He will call them again tomorrow to see if he can reschedule. He would like to speak with Dr. Patsy Lager. Cb# [phone].

## 2015-08-10 NOTE — Progress Notes (Signed)
[facility] [address] [place] [address] [phone]  Date:  08/10/2015   Name:  Bernard Mcbride   DOB:  March 20, 1986   MRN:  [id number]  PCP:  Alysia Penna, MD    Chief Complaint: Arm Problem   History of Present Illness:  Bernard Mcbride is a 29 y.o. very pleasant male patient who presents with the following:  History of MRSA in the past.   A couple of days ago he noted pain in his left forearm.  He has felt a little under the weather, and the left arm got worse yesterday.  It is swollen and tender.  It actually looked more red yesterday but is more painful today He has felt feverish over the last 2 nights but has not measured a fever  He last had MRSA in 2013 he thinks.  He doe suspect that he has MRSA from the way that his feels  History of atrial fib and hyperthyroidism  Patient Active Problem List   Diagnosis Date Noted  . Hyperthyroidism 02/25/2015  . Atrial fibrillation, unspecified 06/22/2014  . Atrial fibrillation 06/22/2014  . Hematemesis 06/30/2013  . Asthma, mild persistent 06/30/2013  . Seasonal allergic rhinitis 06/23/2013  . GERD (gastroesophageal reflux disease) 06/23/2013  . Chronic low back pain 06/23/2013    Past Medical History  Diagnosis Date  . Cellulitis     recurrent; MRSA; pre-patella, lip  . Asthma     seasonal  . Barrett's esophagus   . Colon polyp, hyperplastic   . GERD (gastroesophageal reflux disease)   . PAF (paroxysmal atrial fibrillation)     a. 06/22/14 in the setting of elevated tsh, converted back to NSR after 300mg  fleicanide. No anticoagulation with CHA2DS-VAsc score of 0.   . Elevated TSH     Refered to endocrinologist, Dr. Talmage Coin    History reviewed. No pertinent past surgical history.  Social History  Substance Use Topics  . Smoking status: Current Every Day Smoker -- 0.25 packs/day for 10 years    Types: Cigarettes    Last Attempt to Quit: 11/15/2013  . Smokeless tobacco: Former Neurosurgeon     Types: Snuff     Comment: working on quitting, down to <0.5 ppd  . Alcohol Use: 0.6 oz/week    1 Cans of beer per week    Family History  Problem Relation Age of Onset  . Diabetes Mother   . Hypertension Father   . Breast cancer    . Bladder Cancer    . Prostate cancer    . Heart attack    . Diabetes    . Hypertension    . Thyroid disease Mother     No Known Allergies  Medication list has been reviewed and updated.  Current Outpatient Prescriptions on File Prior to Visit  Medication Sig Dispense Refill  . calcium carbonate (TUMS - DOSED IN MG ELEMENTAL CALCIUM) 500 MG chewable tablet Chew 1-2 tablets by mouth daily as needed for indigestion or heartburn.    Marland Kitchen albuterol (PROVENTIL HFA;VENTOLIN HFA) 108 (90 BASE) MCG/ACT inhaler Inhale 2 puffs into the lungs every 4 (four) hours as needed for wheezing (cough, shortness of breath or wheezing.). (Patient not taking: Reported on 08/10/2015) 1 Inhaler 1  . beclomethasone (QVAR) 80 MCG/ACT inhaler Inhale 2 puffs into the lungs 2 (two) times daily. (Patient not taking: Reported on 08/10/2015) 1 Inhaler 1  . esomeprazole (NEXIUM) 40 MG packet Take 40 mg by mouth daily before  breakfast. (Patient not taking: Reported on 08/10/2015) 30 each 12  . methimazole (TAPAZOLE) 5 MG tablet Take 1 tablet (5 mg total) by mouth 3 (three) times a week. (Patient not taking: Reported on 08/10/2015) 15 tablet 2  . metoprolol tartrate (LOPRESSOR) 25 MG tablet Take 1 tablet (25 mg total) by mouth 2 (two) times daily. (Patient not taking: Reported on 02/15/2015) 60 tablet 6  . traMADol (ULTRAM) 50 MG tablet Take 50 mg by mouth every 6 (six) hours as needed for moderate pain.     Current Facility-Administered Medications on File Prior to Visit  Medication Dose Route Frequency Provider Last Rate Last Dose  . ibuprofen (ADVIL,MOTRIN) tablet 400 mg  400 mg Oral Once Pearline Cables, MD        Review of Systems:  As per HPI- otherwise negative.   Physical  Examination: Filed Vitals:   08/10/15 0826  BP: 100/60  Pulse: 77  Temp: 98.1 F (36.7 C)  Resp: 16   Filed Vitals:   08/10/15 0826  Height: 6' 0.5" (1.842 m)  Weight: 183 lb (83.008 kg)   Body mass index is 24.46 kg/(m^2). Ideal Body Weight: Weight in (lb) to have BMI = 25: 186.5  GEN: WDWN, NAD, Non-toxic, A & O x 3, looks well HEENT: Atraumatic, Normocephalic. Neck supple. No masses, No LAD. Ears and Nose: No external deformity. CV: RRR, No M/G/R. No JVD. No thrill. No extra heart sounds. PULM: CTA B, no wheezes, crackles, rhonchi. No retractions. No resp. distress. No accessory muscle use. EXTR: No c/c/e NEURO Normal gait.  PSYCH: Normally interactive. Conversant. Not depressed or anxious appearing.  Calm demeanor.  Left forearm:  Ventral surface displays an area of redness, mild warmth and tenderness on the proximal radial aspect of the forearm.  There is a firm area which he thinks is the site of the original small wound that led to the infection Left forearm measures 1.5 cm larger than right.   Normal strength, sensation and cap refill of the left hand.  No pain with passive motion of the muscles of the arm Currently do not suspect compartment syndrome.   Results for orders placed or performed in visit on 08/10/15  POCT CBC  Result Value Ref Range   WBC 6.8 4.6 - 10.2 K/uL   Lymph, poc 1.7 0.6 - 3.4   POC LYMPH PERCENT 24.6 10 - 50 %L   MID (cbc) 0.7 0 - 0.9   POC MID % 9.9 0 - 12 %M   POC Granulocyte 4.5 2 - 6.9   Granulocyte percent 65.5 37 - 80 %G   RBC 5.35 4.69 - 6.13 M/uL   Hemoglobin 14.8 14.1 - 18.1 g/dL   HCT, POC 16.1 09.6 - 53.7 %   MCV 83.9 80 - 97 fL   MCH, POC 27.7 27 - 31.2 pg   MCHC 33.0 31.8 - 35.4 g/dL   RDW, POC 04.5 %   Platelet Count, POC 212 142 - 424 K/uL   MPV 7.8 0 - 99.8 fL   Discussed CT vs MRI- pros and cons of each.  He elected to have a CT of his arm today. Arranged stat for pt and planned to call his with results asap   Assessment and Plan: Pain and swelling of left forearm - Plan: CT Forearm Left W Contrast, sulfamethoxazole-trimethoprim (BACTRIM DS,SEPTRA DS) 800-160 MG per tablet  Cellulitis of left arm - Plan: POCT CBC  Started on septra for likely infection of the left  forearm.  Advised that he may have an abscess and that we need to watch this very closely.  Planned for a CT today.  Discussed warning signs and sx of compartment syndrome to watch for Around 8 pm became aware that he did not go to his CT.  Called pt- had to [facility]  Please be sure to get this done tomorrow or come back and see [place] if there is any question that he is not improving.     Signed Abbe Amsterdam, MD

## 2015-08-10 NOTE — Patient Instructions (Signed)
I will give you a call with your CT results today asap Please fill and take the antibiotic right away Take it easy today- use ibuprofen as needed for pain and heat.  Keep your arm about at heart level when you can We will need to recheck tomorrow if you are not definitely improved!!   Your CT will be at [facility]- this evening at 5:20 pm.  You might try calling then in a couple of hours to see if they can get to you sooner [phone].

## 2015-08-11 ENCOUNTER — Inpatient Hospital Stay: Admission: RE | Admit: 2015-08-11 | Payer: Managed Care, Other (non HMO) | Source: Ambulatory Visit

## 2015-08-11 NOTE — Telephone Encounter (Signed)
Called [facility] [facility] and rescheduled for 3:45 today.  Called pt but had to [facility].  Left details about scan, encouraged him to have this done without fail- however if worse he should simply go to the ER for evaluation instead.

## 2015-08-12 ENCOUNTER — Telehealth: Payer: Self-pay | Admitting: Family Medicine

## 2015-08-12 NOTE — Telephone Encounter (Signed)
Called and [facility]- checking on him.  Let us know if any concerns

## 2016-06-07 ENCOUNTER — Ambulatory Visit (INDEPENDENT_AMBULATORY_CARE_PROVIDER_SITE_OTHER): Payer: Self-pay | Admitting: Physician Assistant

## 2016-06-07 ENCOUNTER — Telehealth: Payer: Self-pay

## 2016-06-07 VITALS — BP 108/68 | HR 72 | Temp 98.2°F | Resp 16 | Ht 72.5 in | Wt 195.8 lb

## 2016-06-07 DIAGNOSIS — R05 Cough: Secondary | ICD-10-CM

## 2016-06-07 DIAGNOSIS — R21 Rash and other nonspecific skin eruption: Secondary | ICD-10-CM

## 2016-06-07 DIAGNOSIS — R059 Cough, unspecified: Secondary | ICD-10-CM

## 2016-06-07 DIAGNOSIS — J069 Acute upper respiratory infection, unspecified: Secondary | ICD-10-CM

## 2016-06-07 MED ORDER — TRIAMCINOLONE ACETONIDE 0.5 % EX CREA: 1.0000 "application " | TOPICAL_CREAM | Freq: Three times a day (TID) | CUTANEOUS | Status: DC

## 2016-06-07 MED ORDER — BENZONATATE 100 MG PO CAPS: 100.0000 mg | ORAL_CAPSULE | Freq: Three times a day (TID) | ORAL | Status: DC | PRN

## 2016-06-07 NOTE — Progress Notes (Signed)
Bernard Mcbride  MRN: [id number] DOB: Apr 19, 1986  Subjective:  Bernard Mcbride is a 30 y.o. male seen in office today for a chief complaint of fever. Took temp around dinner time last night and it was 101-102. Has tried tylenol for fever. Worked pretty well. Throughout night, was still sweating but this morning he feels a lot better compared to yesterday. Is not experiencing any other cold symptoms.   Has had poison ivy for over a week on right wrist and lower back. Using calamine lotion OTC but has not seen a doctor for it.   Works as a Administratorlandscaper. No tick bites that he knows of. Not around sick contacts at home or at work.    Patient Active Problem List   Diagnosis Date Noted  . Hyperthyroidism 02/25/2015  . Atrial fibrillation, unspecified 06/22/2014  . Atrial fibrillation (HCC) 06/22/2014  . Hematemesis 06/30/2013  . Asthma, mild persistent 06/30/2013  . Seasonal allergic rhinitis 06/23/2013  . GERD (gastroesophageal reflux disease) 06/23/2013  . Chronic low back pain 06/23/2013    No current outpatient prescriptions on file prior to visit.   Current Facility-Administered Medications on File Prior to Visit  Medication Dose Route Frequency Provider Last Rate Last Dose  . ibuprofen (ADVIL,MOTRIN) tablet 400 mg  400 mg Oral Once Gwenlyn FoundJessica C Copland, MD        No Known Allergies  Review of Systems  Constitutional: Positive for chills, diaphoresis, appetite change and fatigue.  HENT: Positive for congestion. Negative for ear pain, sneezing and sore throat.   Eyes: Negative for pain and itching.  Respiratory: Positive for cough (producitve, yellow green sputum). Negative for chest tightness.   Cardiovascular: Negative for chest pain and palpitations.  Musculoskeletal: Positive for myalgias.   Objective:  BP 108/68 mmHg  Pulse 72  Temp(Src) 98.2 F (36.8 C) (Oral)  Resp 16  Ht 6' 0.5" (1.842 m)  Wt 195 lb 12.8 oz (88.814 kg)  BMI 26.18 kg/m2  SpO2 97%  Physical  Exam  Constitutional: He is oriented to person, place, and time and well-developed, well-nourished, and in no distress.  HENT:  Head: Normocephalic and atraumatic.  Eyes: Conjunctivae are normal.  Neck: Normal range of motion.  Pulmonary/Chest: Effort normal.  Rhonchi noted in RUL posteriorly, cleared after patient coughed.   Neurological: He is alert and oriented to person, place, and time. Gait normal.  Skin: Skin is warm and dry.     No surrounding erythema around maculopapular rash noted.   Psychiatric: Affect normal.  Vitals reviewed.   Assessment and Plan :  1. Acute upper respiratory infection -Likely respiratory viral infection.  - Recommended rest, drink plenty of fluids, eat light meals including soups.  - May also use Tylenol over-the-counter for fever. - Please let me know if you are not seeing any improvement or get worse in 7-10days.  2. Cough -May use cough syrup at night for your cough. Be aware that cough syrup can definitely make you drowsy and sleepy so do not drive or operate any heavy machinery if it is affecting you during the day.  -Prescribed benzonatate (TESSALON) 100w MG capsule; Take 1-2 capsules (100-200 mg total) by mouth 3 (three) times daily as needed for cough.  Dispense: 40 capsule; Refill: 0  2. Rash and nonspecific skin eruption -History and physical exam are consistent with poison ivy. Rash appears in appropriate stage of healing. No signs of infection.  - triamcinolone cream (KENALOG) 0.5 %; Apply 1 application topically 3 (  three) times daily.  Dispense: 30 g; Refill: 0      Benjiman CoreBrittany Milanya Sunderland PA-C  [facility] 06/08/2016 4:38 PM

## 2016-06-07 NOTE — Telephone Encounter (Signed)
The patient's mother states that the patient works in Aeronautical engineerlandscaping and sweats all day, so the cream is not effective for him.  He has been taking OTC cream for 4 days, and it was not working, so she does not expect the prescribed cream to work either.  She is requesting a pill or something that can assist internally, since the topical medication is not working.  She would like this done today, if possible.  Pharmacy is the [facility] on [address].  CB#: [phone]

## 2016-06-07 NOTE — Telephone Encounter (Signed)
Patient's mother is calling because the cream for poison ivy isn't working. She is requesting prednisone. [facility] on [address]

## 2016-06-07 NOTE — Patient Instructions (Addendum)
Continue the Tylenol for fever. Use Tessalon for cough. Rest today. Drink lots of fluids. You should feel better in 2-3 days. Use triamcinolone for rash on back. Should see improvement within 7 days.  -Return to clinic if symptoms worsen, do not improve, or as needed    IF you received an x-ray today, you will receive an invoice from [facility]. Please contact [facility] at [phone] with questions or concerns regarding your invoice.   IF you received labwork today, you will receive an invoice from [facility]. Please contact [facility] at [phone] with questions or concerns regarding your invoice.   Our billing staff will not be able to assist you with questions regarding bills from these companies.  You will be contacted with the lab results as soon as they are available. The fastest way to get your results is to activate your My Chart account. Instructions are located on the last page of this paperwork. If you have not heard from us regarding the results in 2 weeks, please contact this office.     Upper Respiratory Infection, Adult Most upper respiratory infections (URIs) are a viral infection of the air passages leading to the lungs. A URI affects the nose, throat, and upper air passages. The most common type of URI is nasopharyngitis and is typically referred to as "the common cold." URIs run their course and usually go away on their own. Most of the time, a URI does not require medical attention, but sometimes a bacterial infection in the upper airways can follow a viral infection. This is called a secondary infection. Sinus and middle ear infections are common types of secondary upper respiratory infections. Bacterial pneumonia can also complicate a URI. A URI can worsen asthma and chronic obstructive pulmonary disease (COPD). Sometimes, these complications can require emergency medical care and may be life threatening.  CAUSES Almost  all URIs are caused by viruses. A virus is a type of germ and can spread from one person to another.  RISKS FACTORS You may be at risk for a URI if:   You smoke.   You have chronic heart or lung disease.  You have a weakened defense (immune) system.   You are very young or very old.   You have nasal allergies or asthma.  You work in crowded or poorly ventilated areas.  You work in health care facilities or schools. SIGNS AND SYMPTOMS  Symptoms typically develop 2-3 days after you come in contact with a cold virus. Most viral URIs last 7-10 days. However, viral URIs from the influenza virus (flu virus) can last 14-18 days and are typically more severe. Symptoms may include:   Runny or stuffy (congested) nose.   Sneezing.   Cough.   Sore throat.   Headache.   Fatigue.   Fever.   Loss of appetite.   Pain in your forehead, behind your eyes, and over your cheekbones (sinus pain).  Muscle aches.  DIAGNOSIS  Your health care provider may diagnose a URI by:  Physical exam.  Tests to check that your symptoms are not due to another condition such as:  Strep throat.  Sinusitis.  Pneumonia.  Asthma. TREATMENT  A URI goes away on its own with time. It cannot be cured with medicines, but medicines may be prescribed or recommended to relieve symptoms. Medicines may help:  Reduce your fever.  Reduce your cough.  Relieve nasal congestion. HOME CARE INSTRUCTIONS   Take medicines only as directed by your health  care provider.   Gargle warm saltwater or take cough drops to comfort your throat as directed by your health care provider.  Use a warm mist humidifier or inhale steam from a shower to increase air moisture. This may make it easier to breathe.  Drink enough fluid to keep your urine clear or pale yellow.   Eat soups and other clear broths and maintain good nutrition.   Rest as needed.   Return to work when your temperature has returned to  normal or as your health care provider advises. You may need to stay home longer to avoid infecting others. You can also use a face mask and careful hand washing to prevent spread of the virus.  Increase the usage of your inhaler if you have asthma.   Do not use any tobacco products, including cigarettes, chewing tobacco, or electronic cigarettes. If you need help quitting, ask your health care provider. PREVENTION  The best way to protect yourself from getting a cold is to practice good hygiene.   Avoid oral or hand contact with people with cold symptoms.   Wash your hands often if contact occurs.  There is no clear evidence that vitamin C, vitamin E, echinacea, or exercise reduces the chance of developing a cold. However, it is always recommended to get plenty of rest, exercise, and practice good nutrition.  SEEK MEDICAL CARE IF:   You are getting worse rather than better.   Your symptoms are not controlled by medicine.   You have chills.  You have worsening shortness of breath.  You have brown or red mucus.  You have yellow or brown nasal discharge.  You have pain in your face, especially when you bend forward.  You have a fever.  You have swollen neck glands.  You have pain while swallowing.  You have white areas in the back of your throat. SEEK IMMEDIATE MEDICAL CARE IF:   You have severe or persistent:  Headache.  Ear pain.  Sinus pain.  Chest pain.  You have chronic lung disease and any of the following:  Wheezing.  Prolonged cough.  Coughing up blood.  A change in your usual mucus.  You have a stiff neck.  You have changes in your:  Vision.  Hearing.  Thinking.  Mood. MAKE SURE YOU:   Understand these instructions.  Will watch your condition.  Will get help right away if you are not doing well or get worse.   This information is not intended to replace advice given to you by your health care provider. Make sure you discuss any  questions you have with your health care provider.   Document Released: 05/16/2001 Document Revised: 04/06/2015 Document Reviewed: 02/25/2014 Elsevier Interactive Patient Education [employer]2016 Elsevier Inc.

## 2016-06-08 NOTE — Telephone Encounter (Signed)
Brittany please advise

## 2016-06-09 NOTE — Telephone Encounter (Signed)
We discussed the positive and negatives of oral steroids with the patient while in office and it was a collaborative decision to choose the cream. This prescribed cream is stronger than the OTC cream and should work as long as it is on the skin 30 minutes prior to patient sweating. I would recommend using the cream when patient gets home from work and right before bedtime to avoid sweating it off during work. Oral prednisone is not indicated at this time unless the rash begins to get worse as it was appropriately healing when seen in office.

## 2017-03-08 ENCOUNTER — Ambulatory Visit (INDEPENDENT_AMBULATORY_CARE_PROVIDER_SITE_OTHER): Payer: BLUE CROSS/BLUE SHIELD | Admitting: Physician Assistant

## 2017-03-08 VITALS — BP 104/69 | HR 67 | Temp 98.2°F | Resp 16 | Ht 72.0 in | Wt 180.8 lb

## 2017-03-08 DIAGNOSIS — F17200 Nicotine dependence, unspecified, uncomplicated: Secondary | ICD-10-CM

## 2017-03-08 DIAGNOSIS — G4489 Other headache syndrome: Secondary | ICD-10-CM

## 2017-03-08 MED ORDER — PROMETHAZINE HCL 25 MG/ML IJ SOLN
25.0000 mg | Freq: Once | INTRAMUSCULAR | Status: AC
  Administered 2017-03-08: 25 mg via INTRAMUSCULAR

## 2017-03-08 MED ORDER — PROMETHAZINE HCL 25 MG PO TABS: 25.0000 mg | ORAL_TABLET | Freq: Three times a day (TID) | ORAL | 0 refills | Status: DC | PRN

## 2017-03-08 MED ORDER — KETOROLAC TROMETHAMINE 60 MG/2ML IM SOLN
60.0000 mg | Freq: Once | INTRAMUSCULAR | Status: AC
  Administered 2017-03-08: 60 mg via INTRAMUSCULAR

## 2017-03-08 NOTE — Assessment & Plan Note (Signed)
Acute treatment with promethazine and Toradol IM. Rx for promethazine for home. Counseled on complications of headache treatment, including rebound/chronic daily headache. Given his other diagnoses, would recommend neurology evaluation if frequency increases.

## 2017-03-08 NOTE — Progress Notes (Signed)
Patient ID: Bernard Mcbride, male    DOB: 1986/09/06, 31 y.o.   MRN: [id number]  PCP: Alysia Penna, MD, not seen there in several years.  Chief Complaint  Patient presents with  . Migraine    X 3 DAYS  . Nausea    Subjective:   Presents for evaluation of migraine headache.  Monday (03/05/2017) "got off work with a migraine." Started throwing up. Took Excedrin Migraine and Zofran, which helped, but recurred the next day. HA now is "slight," 5/10. Nausea persists intermittently. Recurs whenever he eats or drinks. Not able to eat much. Able to keep down liquids for the most part.  Last migraine before this was months ago. First diagnosed in elementary school, age 68 or 31. He thinks he saw a specialist early on, but isn't sure.  Nausea worse with this headache than usual. No other atypical features of this headache. Photo-/phonophobia. No aura. No weakness. No paresthesias. No visual changes.  PMH includes Barrett's esophagus. Active problems include AFib, asthma, hyperthyroidism. He does not take medications for these issues and relates that he hasn't seen his PCP in several years. He comes to this clinic as needed for acute illnesses.  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    Review of Systems As above,    Patient Active Problem List   Diagnosis Date Noted  . Hyperthyroidism 02/25/2015  . Atrial fibrillation, unspecified 06/22/2014  . Atrial fibrillation (HCC) 06/22/2014  . Hematemesis 06/30/2013  . Asthma, mild persistent 06/30/2013  . Seasonal allergic rhinitis 06/23/2013  . GERD (gastroesophageal reflux disease) 06/23/2013  . Chronic low back pain 06/23/2013     Prior to Admission medications   Medication Sig Start Date End Date Taking? Authorizing Provider  Ondansetron HCl (ZOFRAN PO) Take by mouth as needed.   Yes Historical Provider, MD    triamcinolone cream (KENALOG) 0.5 % Apply 1 application topically 3 (three) times daily. Patient not taking: Reported on 03/08/2017 06/07/16   Magdalene River, PA-C     No Known Allergies     Objective:  Physical Exam  Constitutional: He is oriented to person, place, and time. He appears well-developed and well-nourished. He is active and cooperative. No distress.  BP 104/69 (BP Location: Right Arm, Patient Position: Sitting, Cuff Size: Large)   Pulse 67   Temp 98.2 F (36.8 C) (Oral)   Resp 16   Ht 6' (1.829 m)   Wt 180 lb 12.8 oz (82 kg)   SpO2 97%   BMI 24.52 kg/m   HENT:  Head: Normocephalic and atraumatic.  Right Ear: Hearing normal.  Left Ear: Hearing normal.  Eyes: Conjunctivae are normal. No scleral icterus.  Neck: Normal range of motion. Neck supple. No thyromegaly present.  Cardiovascular: Normal rate, regular rhythm and normal heart sounds.   Pulses:      Radial pulses are 2+ on the right side, and 2+ on the left side.  Pulmonary/Chest: Effort normal and breath sounds normal.  Lymphadenopathy:       Head (right side): No tonsillar, no preauricular, no posterior auricular and no occipital adenopathy present.       Head (left side): No tonsillar, no preauricular, no posterior auricular and no occipital adenopathy present.    He has no cervical adenopathy.       Right: No supraclavicular adenopathy present.       Left: No supraclavicular  adenopathy present.  Neurological: He is alert and oriented to person, place, and time. He has normal strength. No cranial nerve deficit or sensory deficit. Coordination and gait normal.  Reflex Scores:      Bicep reflexes are 2+ on the right side and 2+ on the left side.      Brachioradialis reflexes are 2+ on the left side.      Patellar reflexes are 2+ on the right side.      Achilles reflexes are 2+ on the right side and 2+ on the left side. Skin: Skin is warm, dry and intact. Bruising (antecubital fosssae bilaterally, in  linear distribution overlying veins) and lesion (tiny abrasions in distribution along forearm veins) noted. No rash noted. No cyanosis or erythema. Nails show no clubbing.  Psychiatric: He has a normal mood and affect. His speech is normal and behavior is normal.           Assessment & Plan:   Problem List Items Addressed This Visit    Other headache syndrome - Primary    Acute treatment with promethazine and Toradol IM. Rx for promethazine for home. Counseled on complications of headache treatment, including rebound/chronic daily headache. Given his other diagnoses, would recommend neurology evaluation if frequency increases.      Relevant Medications   promethazine (PHENERGAN) injection 25 mg (Completed)   ketorolac (TORADOL) injection 60 mg (Completed)   promethazine (PHENERGAN) 25 MG tablet   Smoker    Encouraged cessation.       Expressed concern that the lesions on his arms represent IV drug use ("tracks"). 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 Advised that we would like to help him if he is using injectable substances.  Also encouraged him to follow-up with his PCP, given the potential hazards of unmonitored hyperthyroidism, Barrett's esophagus, etc.   Return if symptoms worsen or fail to improve.   Fernande Bras, PA-C [facility]

## 2017-03-08 NOTE — Patient Instructions (Addendum)
It is very important that you see your primary care provider. You have several health conditions that need careful monitoring, and could worsen if left unaddressed, resulting is serious health complications and death.   IF you received an x-ray today, you will receive an invoice from [facility]. Please contact [facility] at [phone] with questions or concerns regarding your invoice.   IF you received labwork today, you will receive an invoice from [place]. Please contact [facility] at [phone] with questions or concerns regarding your invoice.   Our billing staff will not be able to assist you with questions regarding bills from these companies.  You will be contacted with the lab results as soon as they are available. The fastest way to get your results is to activate your My Chart account. Instructions are located on the last page of this paperwork. If you have not heard from [place] regarding the results in 2 weeks, please contact this office.    Did you know that you begin to benefit from quitting smoking within the first twenty minutes? It's TRUE.  At 20 minutes: -blood pressure decreases -pulse rate drops -body temperature of hands and feet increases  At 8 hours: -carbon monoxide level in blood drops to normal -oxygen level in blood increases to normal  At 24 hours: -the chance of heart attack decreases  At 48 hours: -nerve endings start regrowing -ability to smell and taste is enhanced  2 weeks-3 months: -circulation improves -walking becomes easier -lung function improves  1-9 months: -coughing, sinus congestion, fatigue and shortness of breath decreases  1 year: -excess risk of heart disease is decreased to HALF that of a smoker  5 years: Stroke risk is reduced to that of people who have never smoked  10 years: -risk of lung cancer drops to as little as half that of continuing smokers -risk of cancer of the mouth, throat, esophagus,  bladder, kidney and pancreas decreases -risk of ulcer decreases  15 years -risk of heart disease is now similar to that of people who have never smoked -risk of death returns to nearly the level of people who have never smoked

## 2017-03-08 NOTE — Assessment & Plan Note (Signed)
Encouraged cessation.

## 2018-09-18 ENCOUNTER — Ambulatory Visit: Payer: Self-pay | Admitting: Family Medicine

## 2018-09-18 VITALS — HR 70 | Temp 97.6°F | Resp 18 | Wt 187.2 lb

## 2018-09-18 DIAGNOSIS — J329 Chronic sinusitis, unspecified: Secondary | ICD-10-CM

## 2018-09-18 MED ORDER — AMOXICILLIN-POT CLAVULANATE 875-125 MG PO TABS: 1.0000 | ORAL_TABLET | Freq: Two times a day (BID) | ORAL | 0 refills | Status: AC

## 2018-09-18 MED ORDER — PSEUDOEPH-BROMPHEN-DM 30-2-10 MG/5ML PO SYRP: 10.0000 mL | ORAL_SOLUTION | Freq: Three times a day (TID) | ORAL | 0 refills | Status: DC | PRN

## 2018-09-18 MED ORDER — PREDNISONE 20 MG PO TABS: 40.0000 mg | ORAL_TABLET | Freq: Every day | ORAL | 0 refills | Status: AC

## 2018-09-18 NOTE — Progress Notes (Signed)
Bernard Mcbride is a 32 y.o. male who presents today with concerns of sinus congestion for the last 30 days. Patient has attempted treatment with multiple over the counter medication with some relief but symptoms continue to persist. Patient states the his cough is beginiing to keep him up at night. He does report that he is a 1/2 to 3/4 ppd smoker for the last 10 years.   Review of Systems  Constitutional: Negative for chills, fever and malaise/fatigue.  HENT: Positive for congestion and sore throat. Negative for ear discharge, ear pain and sinus pain.   Eyes: Negative.   Respiratory: Positive for cough, sputum production and shortness of breath.   Cardiovascular: Negative.  Negative for chest pain.  Gastrointestinal: Negative for abdominal pain, diarrhea, nausea and vomiting.  Genitourinary: Negative for dysuria, frequency, hematuria and urgency.  Musculoskeletal: Negative for myalgias.  Skin: Negative.   Neurological: Negative for headaches.  Endo/Heme/Allergies: Negative.   Psychiatric/Behavioral: Negative.     O: Vitals:   09/18/18 1610  Resp: 18     Physical Exam  Constitutional: He is oriented to person, place, and time. Vital signs are normal. He appears well-developed and well-nourished. He is active.  Non-toxic appearance. He does not have a sickly appearance.  HENT:  Head: Normocephalic.  Right Ear: Hearing, tympanic membrane, external ear and ear canal normal.  Left Ear: Hearing, tympanic membrane, external ear and ear canal normal.  Nose: Nose normal.  Mouth/Throat: Uvula is midline and oropharynx is clear and moist.  Neck: Normal range of motion. Neck supple.  Cardiovascular: Normal rate, regular rhythm, normal heart sounds and normal pulses.  Pulmonary/Chest: Effort normal. He has wheezes in the right upper field, the right middle field, the right lower field, the left upper field, the left middle field and the left lower field. He has rhonchi in the right upper field,  the right middle field, the right lower field, the left upper field, the left middle field and the left lower field.  Abdominal: Soft. Bowel sounds are normal.  Musculoskeletal: Normal range of motion.  Lymphadenopathy:       Head (right side): No submental and no submandibular adenopathy present.       Head (left side): No submental and no submandibular adenopathy present.    He has no cervical adenopathy.  Neurological: He is alert and oriented to person, place, and time.  Psychiatric: He has a normal mood and affect.  Vitals reviewed.    A: 1. Sinusitis, unspecified chronicity, unspecified location      P: Discussed exam findings, diagnosis etiology and medication use and indications reviewed with patient. Follow- Up and discharge instructions provided. No emergent/urgent issues found on exam.  Patient verbalized understanding of information provided and agrees with plan of care (POC), all questions answered.  1. Sinusitis, unspecified chronicity, unspecified location - amoxicillin-clavulanate (AUGMENTIN) 875-125 MG tablet; Take 1 tablet by mouth 2 (two) times daily for 7 days. - predniSONE (DELTASONE) 20 MG tablet; Take 2 tablets (40 mg total) by mouth daily with breakfast for 5 days. - brompheniramine-pseudoephedrine-DM 30-2-10 MG/5ML syrup; Take 10 mLs by mouth 3 (three) times daily as needed.  Other orders - dextromethorphan-guaiFENesin (MUCINEX DM) 30-600 MG 12hr tablet; Take 1 tablet by mouth 2 (two) times daily. - Pseudoephedrine-APAP-DM (DAYQUIL MULTI-SYMPTOM COLD/FLU PO); Take by mouth. - oxcarbazepine (TRILEPTAL) 600 MG tablet - buprenorphine-naloxone (SUBOXONE) 8-2 mg SUBL SL tablet; TAKE 3 TABLETS SUBLINGUALLY QD

## 2018-09-18 NOTE — Patient Instructions (Signed)

## 2018-10-12 ENCOUNTER — Encounter: Payer: Self-pay | Admitting: Nurse Practitioner

## 2018-10-12 ENCOUNTER — Ambulatory Visit: Payer: Self-pay | Admitting: Nurse Practitioner

## 2018-10-12 VITALS — BP 122/72 | HR 69 | Temp 98.0°F | Wt 191.4 lb

## 2018-10-12 DIAGNOSIS — K0889 Other specified disorders of teeth and supporting structures: Secondary | ICD-10-CM

## 2018-10-12 MED ORDER — AMOXICILLIN 500 MG PO CAPS: 500.0000 mg | ORAL_CAPSULE | Freq: Three times a day (TID) | ORAL | 0 refills | Status: AC

## 2018-10-12 MED ORDER — BENZOCAINE 20 % MT PSTE: 1.0000 "application " | PASTE | Freq: Four times a day (QID) | OROMUCOSAL | 0 refills | Status: AC | PRN

## 2018-10-12 NOTE — Progress Notes (Signed)
History of Present Illness   Patient Identification Bernard Mcbride is a 32 y.o. male.  Patient information was obtained from patient. History/Exam limitations: none.  Chief Complaint  left side tooth pain   Patient who presents with complaint of toothache. Onset of symptoms was abrupt starting 4 days ago. Patient describes pain as "sore" and can become "sharp". Pain severity now is 4 /10. The pain does radiate. Patient denies jaw swelling or fever >101. Pain is aggravated by movement and chewing and talking. Pain is alleviated by acetaminophen and NSAIDS. The patient denies other complaints. Patient has not sought treatment by another care provider for this problem. Care prior to arrival consisted of NSAID and acetaminophen, with moderate relief.   Past Medical History:  Diagnosis Date  . Asthma    seasonal  . Barrett's esophagus   . Cellulitis    recurrent; MRSA; pre-patella, lip  . Colon polyp, hyperplastic   . Elevated TSH    Refered to endocrinologist, Dr. Talmage Coin  . GERD (gastroesophageal reflux disease)   . PAF (paroxysmal atrial fibrillation) (HCC)    a. 06/22/14 in the setting of elevated tsh, converted back to NSR after 300mg  fleicanide. No anticoagulation with CHA2DS-VAsc score of 0.    Family History  Problem Relation Age of Onset  . Diabetes Mother   . Thyroid disease Mother   . Hypertension Father   . Breast cancer Unknown   . Bladder Cancer Unknown   . Prostate cancer Unknown   . Heart attack Unknown   . Diabetes Unknown   . Hypertension Unknown    Scheduled Meds: . ibuprofen  400 mg Oral Once   Continuous Infusions: PRN Meds:  No Known Allergies Social History   Socioeconomic History  . Marital status: Single    Spouse name: Not on file  . Number of children: 0  . Years of education: Assoc.  . Highest education level: Not on file  Occupational History  . Occupation: Landscaping    Comment: [employer]  Social Needs  . Financial  resource strain: Not on file  . Food insecurity:    Worry: Not on file    Inability: Not on file  . Transportation needs:    Medical: Not on file    Non-medical: Not on file  Tobacco Use  . Smoking status: Current Every Day Smoker    Packs/day: 0.50    Years: 13.00    Pack years: 6.50    Types: Cigarettes    Last attempt to quit: 11/15/2013    Years since quitting: 4.9  . Smokeless tobacco: Former Neurosurgeon    Types: Snuff  . Tobacco comment: working on quitting, down to <0.5 ppd  Substance and Sexual Activity  . Alcohol use: Yes    Alcohol/week: 1.0 standard drinks    Types: 1 Cans of beer per week  . Drug use: No  . Sexual activity: Not on file  Lifestyle  . Physical activity:    Days per week: Not on file    Minutes per session: Not on file  . Stress: Not on file  Relationships  . Social connections:    Talks on phone: Not on file    Gets together: Not on file    Attends religious service: Not on file    Active member of club or organization: Not on file    Attends meetings of clubs or organizations: Not on file    Relationship status: Not on file  . Intimate partner  violence:    Fear of current or ex partner: Not on file    Emotionally abused: Not on file    Physically abused: Not on file    Forced sexual activity: Not on file  Other Topics Concern  . Not on file  Social History Narrative   Lives with his parents.   Review of Systems Constitutional: negative Eyes: negative Ears, nose, mouth, throat, and face: positive for left sided tooth pain, negative for ear drainage, earaches, nasal congestion, sore mouth and sore throat Respiratory: negative Cardiovascular: negative Gastrointestinal: negative Neurological: negative   Physical Exam   BP 122/72   Pulse 69   Temp 98 F (36.7 C)   Wt 191 lb 6.4 oz (86.8 kg)   SpO2 96%   BMI 25.96 kg/m   Physical Exam  Constitutional: He is oriented to person, place, and time. He appears well-developed and  well-nourished. No distress.  HENT:  Head: Normocephalic.  Right Ear: External ear normal.  Left Ear: External ear normal.  Nose: Nose normal.  Mouth/Throat: Oropharynx is clear and moist.    1st premolar tooth chipped, approximately 1/2 tooth, + decay noted. No facial swelling  Eyes: Pupils are equal, round, and reactive to light. Conjunctivae are normal.  Neck: Normal range of motion. Neck supple. No tracheal deviation present. No thyromegaly present.  Cardiovascular: Normal rate, regular rhythm and normal heart sounds.  Pulmonary/Chest: Effort normal and breath sounds normal.  Abdominal: Soft. Bowel sounds are normal.  Neurological: He is alert and oriented to person, place, and time.  Skin: Skin is warm and dry.    Assessment and Plan   Exam findings, diagnosis etiology and medication use and indications reviewed with patient. Follow- Up and discharge instructions provided. No emergent/urgent issues found on exam. Patient education was provided. Patient verbalized understanding of information provided and agrees with plan of care (POC), all questions answered. The patient is advised to call or return to clinic if condition does not see an improvement in symptoms, or to seek the care of the closest emergency department if condition worsens with the above plan.   1. Dentalgia  - amoxicillin (AMOXIL) 500 MG capsule; Take 1 capsule (500 mg total) by mouth 3 (three) times daily for 10 days.  Dispense: 30 capsule; Refill: 0 - benzocaine (ORABASE-B) 20 % PSTE; Use as directed 1 application in the mouth or throat 4 (four) times daily as needed for up to 10 days for mouth pain.  Dispense: 1 each; Refill: 0 -Take medication as prescribed. -Ibuprofen 800 mg every 8 hours.  May take Tylenol extra strength 500 mg 1-2 tabs every 6 hours for breakthrough pain.  -Warm salt water rinses for tooth pain or discomfort. -May use a warm compress to the left side of the face to help with to pain or  discomfort. -If you develop facial swelling, fever, chills, or worsening tooth pain, follow-up in the emergency department. -Please contact 1 of the dentist on the list provided for an appointment within the next 5 to 7 days. -Follow-up in our clinic as needed.

## 2018-10-12 NOTE — Patient Instructions (Addendum)
Dental Pain -Take medication as prescribed. -Ibuprofen 800 mg every 8 hours.  May take Tylenol extra strength 500 mg 1-2 tabs every 6 hours for breakthrough pain.  -Warm salt water rinses for tooth pain or discomfort. -May use a warm compress to the left side of the face to help with to pain or discomfort. -If you develop facial swelling, fever, chills, or worsening tooth pain, follow-up in the emergency department. -Please contact 1 of the dentist on the list provided for an appointment within the next 5 to 7 days. -Follow-up in our clinic as needed.   Dental pain may be caused by many things, including:  Tooth decay (cavities or caries). Cavities expose the nerve of your tooth to air and hot or cold temperatures. This can cause pain or discomfort.  Abscess or infection. A dental abscess is a collection of infected pus from a bacterial infection in the inner part of the tooth (pulp). It usually occurs at the end of the tooth's root.  Injury.  An unknown reason (idiopathic).  Your pain may be mild or severe. It may only occur when:  You are chewing.  You are exposed to hot or cold temperature.  You are eating or drinking sugary foods or beverages, such as soda or candy.  Your pain may also be constant. Follow these instructions at home: Watch your dental pain for any changes. The following actions may help to lessen any discomfort that you are feeling:  Take medicines only as directed by your dentist.  If you were prescribed an antibiotic medicine, finish all of it even if you start to feel better.  Keep all follow-up visits as directed by your dentist. This is important.  Do not apply heat to the outside of your face.  Rinse your mouth or gargle with salt water if directed by your dentist. This helps with pain and swelling. ? You can make salt water by adding  tsp of salt to 1 cup of warm water.  Apply ice to the painful area of your face: ? Put ice in a plastic  bag. ? Place a towel between your skin and the bag. ? Leave the ice on for 20 minutes, 2-3 times per day.  Avoid foods or drinks that cause you pain, such as: ? Very hot or very cold foods or drinks. ? Sweet or sugary foods or drinks.  Contact a health care provider if:  Your pain is not controlled with medicines.  Your symptoms are worse.  You have new symptoms. Get help right away if:  You are unable to open your mouth.  You are having trouble breathing or swallowing.  You have a fever.  Your face, neck, or jaw is swollen. This information is not intended to replace advice given to you by your health care provider. Make sure you discuss any questions you have with your health care provider. Document Released: 11/20/2005 Document Revised: 03/30/2016 Document Reviewed: 11/16/2014 Elsevier Interactive Patient Education  [employer].

## 2019-11-21 ENCOUNTER — Telehealth: Payer: Self-pay

## 2019-11-21 NOTE — Telephone Encounter (Signed)
COVID-19 Pre-Screening Questions:11/21/19   Do you currently have a fever (>100 F), chills or unexplained body aches? NO  Are you currently experiencing new cough, shortness of breath, sore throat, runny nose? NO  .  Have you recently travelled outside the state of [place] in the last 14 days? NO  .  Have you been in contact with someone that is currently pending confirmation of Covid19 testing or has been confirmed to have the Covid19 virus?  NO  **If the patient answers NO to ALL questions -  advise the patient to please call the clinic before coming to the office should any symptoms develop.     

## 2019-11-24 ENCOUNTER — Ambulatory Visit (INDEPENDENT_AMBULATORY_CARE_PROVIDER_SITE_OTHER): Payer: Self-pay | Admitting: Internal Medicine

## 2019-11-24 ENCOUNTER — Telehealth: Payer: Self-pay | Admitting: Pharmacy Technician

## 2019-11-24 ENCOUNTER — Encounter: Payer: Self-pay | Admitting: Internal Medicine

## 2019-11-24 ENCOUNTER — Other Ambulatory Visit: Payer: Self-pay

## 2019-11-24 DIAGNOSIS — B182 Chronic viral hepatitis C: Secondary | ICD-10-CM

## 2019-11-24 NOTE — Telephone Encounter (Signed)
[facility] Patient Advocate Encounter  Gathered applications and income information to submit to get Hepatitis C medication approval.  We will follow until a medication is written.  Bernard Mcbride. Bernard Mcbride Patient [facility] Phone: [phone] Fax:  [phone]

## 2019-11-24 NOTE — Patient Instructions (Signed)
Date 11/24/19  Dear Mr. Bernard Mcbride, As discussed in the [facility], your hepatitis C therapy will include highly effective medication(s) for treatment and will vary based on the type of hepatitis C and insurance approval.  Potential medications include:          Harvoni (sofosbuvir 90mg /ledipasvir 400mg ) tablet oral daily          OR     Epclusa (sofosbuvir 400mg /velpatasvir 100mg ) tablet oral daily          OR      Mavyret (glecaprevir 100 mg/pibrentasvir 40 mg): Take 3 tablets oral daily              Medications are typically for 8 or 12 weeks total ---------------------------------------------------------------- Your HCV Treatment Start Date: You will be notified by our office once the medication is approved and where you can pick it up (or if mailed)   ---------------------------------------------------------------- [place]:   [facility] [place], [place] [address] Phone: [phone] Hours: Monday to Friday 7:30 am to 6:00 pm   Please always contact your pharmacy at least 3-4 business days before you run out of medications to ensure your next month's medication is ready or 1 week prior to running out if you receive it by mail.  Remember, each prescription is for 28 days. ---------------------------------------------------------------- GENERAL NOTES REGARDING YOUR HEPATITIS C MEDICATION:  Some medications have the following interactions:  - Acid reducing agents such as H2 blockers (ie. Pepcid (famotidine), Zantac (ranitidine), Tagamet (cimetidine), Axid (nizatidine) and proton pump inhibitors (ie. Prilosec (omeprazole), Protonix (pantoprazole), Nexium (esomeprazole), or Aciphex (rabeprazole)). Do not take until you have discussed with a health care provider.    -Antacids that contain magnesium and/or aluminum hydroxide (ie. Milk of Magensia, Rolaids, Gaviscon, Maalox, Mylanta, an dArthritis Pain Formula).  -Calcium carbonate (calcium  supplements or antacids such as Tums, Caltrate, Os-Cal).  -St. John's wort or any products that contain St. John's wort like some herbal supplements  Please inform the office prior to starting any of these medications.  - The common side effects associated with Harvoni include:      1. Fatigue      2. Headache      3. Nausea      4. Diarrhea      5. Insomnia  Please note that this only lists the most common side effects and is NOT a comprehensive list of the potential side effects of these medications. For more information, please review the drug information sheets that come with your medication package from the pharmacy.  ---------------------------------------------------------------- GENERAL HELPFUL HINTS ON HCV THERAPY: 1. Stay well-hydrated. 2. Notify the [facility] of any changes in your other over-the-counter/herbal or prescription medications. 3. If you miss a dose of your medication, take the missed dose as soon as you remember. Return to your regular time/dose schedule the next day.  4.  Do not stop taking your medications without first talking with your healthcare provider. 5.  You may take Tylenol (acetaminophen), as long as the dose is less than 2000 mg (OR no more than 4 tablets of the Tylenol Extra Strengths 500mg  tablet) in 24 hours. 6.  You will see our pharmacist-specialist within the first 2 weeks of starting your medication to monitor for any possible side effects. 7.  You will have labs once during treatment, soon after treatment completion and one final lab 6 months after treatment completion to verify the virus is out of your system.  Okey Regal  Linus Salmons, [place] for [facility] [place], [place]  [phone]

## 2019-11-24 NOTE — Progress Notes (Signed)
[facility]   CC: consideration for treatment for chronic hepatitis C  HPI:  +Bernard Mcbride is a 33 y.o. male who presents for initial evaluation and management of chronic hepatitis C.  Patient tested positive earlier this year from a blood donation. Hepatitis C-associated risk factors present are: IV drug abuse (details: last used 3 years ago). Patient denies history of blood transfusion, tattoos. Patient has had other studies performed. Results: hepatitis C RNA by PCR, result: positive. Patient has not had prior treatment for Hepatitis C. Patient does not have a past history of liver disease. Patient does not have a family history of liver disease. Patient does not  have associated signs or symptoms related to liver disease.  Labs reviewed and confirm chronic hepatitis C with a positive viral load.   Records reviewed from HD and positive HCV RNA qualitative.        Patient does not have documented immunity to Hepatitis A. Patient does not have documented immunity to Hepatitis B.    Review of Systems:  Constitutional: negative for fatigue, malaise and anorexia Gastrointestinal: negative for nausea and diarrhea Integument/breast: negative for rash Musculoskeletal: negative for myalgias and arthralgias All other systems reviewed and are negative       Past Medical History:  Diagnosis Date  . Asthma    seasonal  . Barrett's esophagus   . Cellulitis    recurrent; MRSA; pre-patella, lip  . Colon polyp, hyperplastic   . Elevated TSH    Refered to endocrinologist, Dr. Talmage Coin  . GERD (gastroesophageal reflux disease)   . PAF (paroxysmal atrial fibrillation) (HCC)    a. 06/22/14 in the setting of elevated tsh, converted back to NSR after 300mg  fleicanide. No anticoagulation with CHA2DS-VAsc score of 0.     Prior to Admission medications   Medication Sig Start Date End Date Taking? Authorizing Provider  buprenorphine-naloxone (SUBOXONE) 8-2 mg SUBL  SL tablet TAKE 3 TABLETS SUBLINGUALLY QD 09/16/18  Yes [provider]  oxcarbazepine (TRILEPTAL) 600 MG tablet  08/21/18  Yes [provider]  brompheniramine-pseudoephedrine-DM 30-2-10 MG/5ML syrup Take 10 mLs by mouth 3 (three) times daily as needed. Patient not taking: Reported on 11/24/2019 09/18/18   09/20/18, NP  dextromethorphan-guaiFENesin [facility] DM) 30-600 MG 12hr tablet Take 1 tablet by mouth 2 (two) times daily.    [provider]  Ondansetron HCl (ZOFRAN PO) Take by mouth as needed.    [provider]  promethazine (PHENERGAN) 25 MG tablet Take 1 tablet (25 mg total) by mouth every 8 (eight) hours as needed for nausea or vomiting. Patient not taking: Reported on 09/18/2018 03/08/17   05/08/17, PA  Pseudoephedrine-APAP-DM (DAYQUIL MULTI-SYMPTOM COLD/FLU PO) Take by mouth.    [provider]  triamcinolone cream (KENALOG) 0.5 % Apply 1 application topically 3 (three) times daily. Patient not taking: Reported on 03/08/2017 06/07/16   08/08/16 D, PA-C    No Known Allergies  Social History   Tobacco Use  . Smoking status: Current Every Day Smoker    Packs/day: 0.50    Years: 13.00    Pack years: 6.50    Types: Cigarettes    Last attempt to quit: 11/15/2013    Years since quitting: 6.0  . Smokeless tobacco: Former 11/17/2013    Types: Snuff  . Tobacco comment: working on quitting, down to <0.5 ppd  Substance Use Topics  . Alcohol use: Yes    Alcohol/week: 1.0 standard drinks  Types: 1 Cans of beer per week  . Drug use: No    Family History  Problem Relation Age of Onset  . Diabetes Mother   . Thyroid disease Mother   . Hypertension Father   . Breast cancer Unknown   . Bladder Cancer Unknown   . Prostate cancer Unknown   . Heart attack Unknown   . Diabetes Unknown   . Hypertension Unknown       Objective:  Constitutional: in no apparent distress,  Vitals:   11/24/19 0915  BP: (!) 137/97  Pulse: 87    Temp: (!) 97.4 F (36.3 C)   Eyes: anicteric Cardiovascular: Cor RRR Respiratory: CTA B; normal respiratory effort Gastrointestinal: Bowel sounds are normal, liver is not enlarged, spleen is not enlarged Musculoskeletal: no pedal edema noted Skin: negatives: no rash; no porphyria cutanea tarda Lymphatic: no cervical lymphadenopathy   Laboratory Genotype: No results found for: HCVGENOTYPE HCV viral load: No results found for: HCVQUANT Lab Results  Component Value Date   WBC 6.8 08/10/2015   HGB 14.8 08/10/2015   HCT 44.8 08/10/2015   MCV 83.9 08/10/2015   PLT 184 06/23/2014    Lab Results  Component Value Date   CREATININE 0.94 02/15/2015   BUN 11 02/15/2015   NA 141 02/15/2015   K 4.2 02/15/2015   CL 102 02/15/2015   CO2 24 02/15/2015    Lab Results  Component Value Date   ALT 19 02/15/2015   AST 16 02/15/2015   ALKPHOS 66 02/15/2015     Labs and history reviewed and show CHILD-PUGH unknown  5-6 points: Child class A 7-9 points: Child class B 10-15 points: Child class C  Lab Results  Component Value Date   BILITOT 0.6 02/15/2015   ALBUMIN 4.2 02/15/2015     Assessment: New Patient with Chronic Hepatitis C genotype unknown, untreated.  I discussed with the patient the lab findings that confirm chronic hepatitis C as well as the natural history and progression of disease including about 30% of people who develop cirrhosis of the liver if left untreated and once cirrhosis is established there is a 2-7% risk per year of liver cancer and liver failure.  I discussed the importance of treatment and benefits in reducing the risk, even if significant liver fibrosis exists.   Plan: 1) Patient counseled extensively on limiting acetaminophen to no more than 2 grams daily, avoidance of alcohol. 2) Transmission discussed with patient including sexual transmission, sharing razors and toothbrush.   3) Will need referral to gastroenterology if concern for cirrhosis 4) Will  need referral for substance abuse counseling: No.; Further work up to include urine drug screen  No. 5) Will prescribe appropriate medication based on genotype and coverage  6) Hepatitis A and B titers 7) Pneumovax vaccine, next visit since we are out of stock today 8) Further work up to include liver staging with labs, ultrasound if there are any concerning labs.  Likely positive for a short duration.  9) will follow up after starting medication

## 2019-12-03 LAB — COMPLETE METABOLIC PANEL WITH GFR
AG Ratio: 1.6 (calc) (ref 1.0–2.5)
ALT: 32 U/L (ref 9–46)
AST: 30 U/L (ref 10–40)
Albumin: 4.5 g/dL (ref 3.6–5.1)
Alkaline phosphatase (APISO): 77 U/L (ref 36–130)
BUN: 11 mg/dL (ref 7–25)
CO2: 28 mmol/L (ref 20–32)
Calcium: 9.6 mg/dL (ref 8.6–10.3)
Chloride: 104 mmol/L (ref 98–110)
Creat: 0.84 mg/dL (ref 0.60–1.35)
GFR, Est African American: 131 mL/min/{1.73_m2} (ref >=60)
GFR, Est Non African American: 113 mL/min/{1.73_m2} (ref >=60)
Globulin: 2.8 g/dL (calc) (ref 1.9–3.7)
Glucose, Bld: 101 mg/dL — ABNORMAL HIGH (ref 65–99)
Potassium: 4.8 mmol/L (ref 3.5–5.3)
Sodium: 139 mmol/L (ref 135–146)
Total Bilirubin: 0.3 mg/dL (ref 0.2–1.2)
Total Protein: 7.3 g/dL (ref 6.1–8.1)

## 2019-12-03 LAB — LIVER FIBROSIS, FIBROTEST-ACTITEST
ALT: 33 U/L (ref 9–46)
Alpha-2-Macroglobulin: 228 mg/dL (ref 106–279)
Apolipoprotein A1: 111 mg/dL (ref 94–176)
Bilirubin: 0.3 mg/dL (ref 0.2–1.2)
Fibrosis Score: 0.27
GGT: 81 U/L (ref 3–90)
Haptoglobin: 138 mg/dL (ref 43–212)
Necroinflammat ACT Score: 0.16
Reference ID: 3208021

## 2019-12-03 LAB — CBC WITH DIFFERENTIAL/PLATELET
Absolute Monocytes: 349 cells/uL (ref 200–950)
Basophils Absolute: 29 cells/uL (ref 0–200)
Basophils Relative: 0.7 %
Eosinophils Absolute: 139 cells/uL (ref 15–500)
Eosinophils Relative: 3.3 %
HCT: 45.6 % (ref 38.5–50.0)
Hemoglobin: 15.7 g/dL (ref 13.2–17.1)
Lymphs Abs: 1672 cells/uL (ref 850–3900)
MCH: 30.5 pg (ref 27.0–33.0)
MCHC: 34.4 g/dL (ref 32.0–36.0)
MCV: 88.5 fL (ref 80.0–100.0)
MPV: 10.5 fL (ref 7.5–12.5)
Monocytes Relative: 8.3 %
Neutro Abs: 2012 cells/uL (ref 1500–7800)
Neutrophils Relative %: 47.9 %
Platelets: 226 10*3/uL (ref 140–400)
RBC: 5.15 10*6/uL (ref 4.20–5.80)
RDW: 14.1 % (ref 11.0–15.0)
Total Lymphocyte: 39.8 %
WBC: 4.2 10*3/uL (ref 3.8–10.8)

## 2019-12-03 LAB — HEPATITIS C RNA QUANTITATIVE
HCV Quantitative Log: 7.1 Log IU/mL — ABNORMAL HIGH
HCV RNA, PCR, QN: 12700000 IU/mL — ABNORMAL HIGH

## 2019-12-03 LAB — HIV ANTIBODY (ROUTINE TESTING W REFLEX): HIV 1&2 Ab, 4th Generation: NONREACTIVE

## 2019-12-03 LAB — HEPATITIS C GENOTYPE: HCV Genotype: 2

## 2019-12-03 LAB — HEPATITIS B CORE ANTIBODY, TOTAL: Hep B Core Total Ab: NONREACTIVE

## 2019-12-03 LAB — HEPATITIS B SURFACE ANTIGEN: Hepatitis B Surface Ag: NONREACTIVE

## 2019-12-03 LAB — HEPATITIS A ANTIBODY, TOTAL: Hepatitis A AB,Total: NONREACTIVE

## 2019-12-03 LAB — HEPATITIS B SURFACE ANTIBODY,QUALITATIVE: Hep B S Ab: NONREACTIVE

## 2019-12-08 ENCOUNTER — Telehealth: Payer: Self-pay | Admitting: Pharmacy Technician

## 2019-12-08 ENCOUNTER — Other Ambulatory Visit: Payer: Self-pay | Admitting: Internal Medicine

## 2019-12-08 MED ORDER — SOFOSBUVIR-VELPATASVIR 400-100 MG PO TABS: 1.0000 | ORAL_TABLET | Freq: Every day | ORAL | 2 refills | Status: AC

## 2019-12-08 NOTE — Telephone Encounter (Signed)
[facility] Patient Advocate Encounter  Completed and sent Support Path application for Epclusa for this patient who is uninsured.    Patient assistance phone number for follow up is [phone].   This encounter will be updated until final determination.   Sota Hetz E. Breslin Hemann, CPhT Specialty Pharmacy Patient Advocate [facility] Phone: [phone] Fax:  [phone]   

## 2019-12-09 NOTE — Telephone Encounter (Signed)
[facility] Patient Advocate Encounter  He is approved for [place] assistance through [employer].  They will mail the medication directly to the patient's home.  We will follow up to get a start date and follow up appointment.  Netty Starring. Dimas Aguas CPhT Specialty Pharmacy Patient [facility] Phone: [phone] Fax:  [phone]

## 2019-12-16 ENCOUNTER — Encounter: Payer: Self-pay | Admitting: Pharmacy Technician

## 2019-12-31 ENCOUNTER — Telehealth: Payer: Self-pay | Admitting: Pharmacy Technician

## 2019-12-31 NOTE — Telephone Encounter (Signed)
[facility] Patient Advocate Encounter  [facility] called to set up patient's medication shipment. They have the application stating that they call us to set up shipments to his home. I verified the address in Epic. His is set to arrive to the home address on 01/05/2020. His next appointment is 02/09.

## 2020-01-13 ENCOUNTER — Other Ambulatory Visit: Payer: Self-pay

## 2020-01-13 ENCOUNTER — Ambulatory Visit (INDEPENDENT_AMBULATORY_CARE_PROVIDER_SITE_OTHER): Payer: Self-pay | Admitting: Internal Medicine

## 2020-01-13 ENCOUNTER — Encounter: Payer: Self-pay | Admitting: Internal Medicine

## 2020-01-13 VITALS — BP 147/103 | HR 84 | Temp 97.9°F | Wt 229.0 lb

## 2020-01-13 DIAGNOSIS — Z23 Encounter for immunization: Secondary | ICD-10-CM | POA: Insufficient documentation

## 2020-01-13 DIAGNOSIS — Z5181 Encounter for therapeutic drug level monitoring: Secondary | ICD-10-CM

## 2020-01-13 DIAGNOSIS — B182 Chronic viral hepatitis C: Secondary | ICD-10-CM

## 2020-01-13 NOTE — Assessment & Plan Note (Signed)
Will check a cmp on the medication 

## 2020-01-13 NOTE — Progress Notes (Signed)
   Subjective:    Patient ID: Bernard Mcbride, male    DOB: 1986/02/09, 34 y.o.   MRN: [id number]  HPI Here for follow up of chronic hepatitis C He has genotype 2 with an initial viral load of 12 million.  He started on 12 weeks of Epclusa and is on his second bottle.  No associated fatigue or headaches.  In fact, he feels like he has more energy.  Fibrosure with F1/A0.     Review of Systems  Constitutional: Negative for fatigue.  Gastrointestinal: Negative for diarrhea.  Skin: Negative for rash.  Neurological: Negative for headaches.       Objective:   Physical Exam Constitutional:      Appearance: Normal appearance.  Eyes:     General: No scleral icterus. Cardiovascular:     Rate and Rhythm: Normal rate and regular rhythm.  Pulmonary:     Effort: Pulmonary effort is normal.  Neurological:     General: No focal deficit present.     Mental Status: He is alert.  Psychiatric:        Mood and Affect: Mood normal.   SH: + tobacco        Assessment & Plan:

## 2020-01-13 NOTE — Assessment & Plan Note (Signed)
Non-immune to hepatitis A and B and will start series today

## 2020-01-13 NOTE — Assessment & Plan Note (Signed)
Doing well on the medication.  I will check his viral load now and he can rtc in 2 months for EOT lab.

## 2020-01-16 LAB — HEPATITIS C RNA QUANTITATIVE
HCV Quantitative Log: <1.18 Log IU/mL — AB
HCV RNA, PCR, QN: <15 IU/mL — AB

## 2020-01-16 LAB — COMPLETE METABOLIC PANEL WITH GFR
AG Ratio: 1.7 (calc) (ref 1.0–2.5)
ALT: 11 U/L (ref 9–46)
AST: 21 U/L (ref 10–40)
Albumin: 4.6 g/dL (ref 3.6–5.1)
Alkaline phosphatase (APISO): 74 U/L (ref 36–130)
BUN: 9 mg/dL (ref 7–25)
CO2: 28 mmol/L (ref 20–32)
Calcium: 9.8 mg/dL (ref 8.6–10.3)
Chloride: 103 mmol/L (ref 98–110)
Creat: 0.75 mg/dL (ref 0.60–1.35)
GFR, Est African American: 138 mL/min/{1.73_m2} (ref >=60)
GFR, Est Non African American: 119 mL/min/{1.73_m2} (ref >=60)
Globulin: 2.7 g/dL (calc) (ref 1.9–3.7)
Glucose, Bld: 91 mg/dL (ref 65–99)
Potassium: 4.4 mmol/L (ref 3.5–5.3)
Sodium: 137 mmol/L (ref 135–146)
Total Bilirubin: 0.3 mg/dL (ref 0.2–1.2)
Total Protein: 7.3 g/dL (ref 6.1–8.1)

## 2020-01-20 ENCOUNTER — Telehealth: Payer: Self-pay | Admitting: Pharmacy Technician

## 2020-01-20 NOTE — Telephone Encounter (Signed)
[facility] Patient Advocate Encounter  [facility] called to verify address and set up final shipment of Epclusa.  Medication set to arrive 02/23 to his home address.

## 2020-02-19 ENCOUNTER — Ambulatory Visit: Payer: Self-pay | Attending: Internal Medicine

## 2020-02-19 DIAGNOSIS — Z23 Encounter for immunization: Secondary | ICD-10-CM

## 2020-02-19 NOTE — Progress Notes (Signed)
   Covid-19 Vaccination Clinic  Name:  Bernard Mcbride    MRN: [id number] DOB: 01-06-1986  02/19/2020  [name] was observed post Covid-19 immunization for 15 minutes without incident. He was provided with Vaccine Information Sheet and instruction to access the V-Safe system.   Mr. Mears was instructed to call 911 with any severe reactions post vaccine: Marland Kitchen Difficulty breathing  . Swelling of face and throat  . A fast heartbeat  . A bad rash all over body  . Dizziness and weakness   Immunizations Administered    Name Date Dose VIS Date Route   Pfizer COVID-19 Vaccine 02/19/2020  3:04 PM 0.3 mL 11/14/2019 Intramuscular   Manufacturer: [employer], [employer]   Lot: [id number]   NDC: [address]-6

## 2020-03-10 ENCOUNTER — Encounter: Payer: Self-pay | Admitting: Internal Medicine

## 2020-03-10 ENCOUNTER — Ambulatory Visit (INDEPENDENT_AMBULATORY_CARE_PROVIDER_SITE_OTHER): Payer: Self-pay | Admitting: Internal Medicine

## 2020-03-10 ENCOUNTER — Other Ambulatory Visit: Payer: Self-pay

## 2020-03-10 VITALS — BP 154/80 | HR 89 | Temp 97.6°F | Ht 72.0 in | Wt 226.0 lb

## 2020-03-10 DIAGNOSIS — F17200 Nicotine dependence, unspecified, uncomplicated: Secondary | ICD-10-CM

## 2020-03-10 DIAGNOSIS — Z23 Encounter for immunization: Secondary | ICD-10-CM

## 2020-03-10 DIAGNOSIS — I10 Essential (primary) hypertension: Secondary | ICD-10-CM | POA: Insufficient documentation

## 2020-03-10 DIAGNOSIS — B182 Chronic viral hepatitis C: Secondary | ICD-10-CM

## 2020-03-10 NOTE — Assessment & Plan Note (Signed)
Will get hepatitis B #2 today and hepatitis A next visit

## 2020-03-10 NOTE — Assessment & Plan Note (Signed)
Doing well and now completed treatment.  Will check EOT lab today and he can rtc in 4 months for SVR 12 and final visit.

## 2020-03-10 NOTE — Assessment & Plan Note (Signed)
   Discussed smoking cessation. 

## 2020-03-10 NOTE — Progress Notes (Signed)
   Subjective:    Patient ID: Bernard Mcbride, male    DOB: [birth year], 34 y.o.   MRN: [id number]  HPI Here for follow up of chronic hepatitis C.  Initial viral load of 12 million, Fibrsure of F1/A0.  Started Epclusa for genotype 2 infection.   Started hepatitis A and B series.   BP has been up.   Review of Systems  Constitutional: Negative for fatigue.  Gastrointestinal: Negative for diarrhea.  Skin: Negative for rash.  Neurological: Negative for headaches.       Objective:   Physical Exam Constitutional:      Appearance: Normal appearance.  Eyes:     General: No scleral icterus. Cardiovascular:     Rate and Rhythm: Normal rate and regular rhythm.  Pulmonary:     Effort: Pulmonary effort is normal.  Neurological:     General: No focal deficit present.     Mental Status: He is alert.  Psychiatric:        Mood and Affect: Mood normal.   SH: + tobacco        Assessment & Plan:

## 2020-03-10 NOTE — Assessment & Plan Note (Signed)
BP has been up and I encouraged him to get a PCP

## 2020-03-12 LAB — COMPLETE METABOLIC PANEL WITH GFR
AG Ratio: 1.9 (calc) (ref 1.0–2.5)
ALT: 14 U/L (ref 9–46)
AST: 14 U/L (ref 10–40)
Albumin: 4.7 g/dL (ref 3.6–5.1)
Alkaline phosphatase (APISO): 64 U/L (ref 36–130)
BUN: 12 mg/dL (ref 7–25)
CO2: 30 mmol/L (ref 20–32)
Calcium: 9.5 mg/dL (ref 8.6–10.3)
Chloride: 104 mmol/L (ref 98–110)
Creat: 0.85 mg/dL (ref 0.60–1.35)
GFR, Est African American: 131 mL/min/{1.73_m2} (ref >=60)
GFR, Est Non African American: 113 mL/min/{1.73_m2} (ref >=60)
Globulin: 2.5 g/dL (calc) (ref 1.9–3.7)
Glucose, Bld: 103 mg/dL — ABNORMAL HIGH (ref 65–99)
Potassium: 4.5 mmol/L (ref 3.5–5.3)
Sodium: 138 mmol/L (ref 135–146)
Total Bilirubin: 0.2 mg/dL (ref 0.2–1.2)
Total Protein: 7.2 g/dL (ref 6.1–8.1)

## 2020-03-12 LAB — HEPATITIS C RNA QUANTITATIVE
HCV Quantitative Log: <1.18 Log IU/mL
HCV RNA, PCR, QN: <15 IU/mL

## 2020-03-15 ENCOUNTER — Ambulatory Visit: Payer: Self-pay

## 2020-03-29 ENCOUNTER — Ambulatory Visit: Payer: Self-pay | Attending: Internal Medicine

## 2020-03-29 DIAGNOSIS — Z23 Encounter for immunization: Secondary | ICD-10-CM

## 2020-03-29 NOTE — Progress Notes (Signed)
   Covid-19 Vaccination Clinic  Name:  STEPHONE GUM    MRN: [id number] DOB: 06/27/1986  03/29/2020  Mr. Abeln was observed post Covid-19 immunization for 15 minutes without incident. He was provided with Vaccine Information Sheet and instruction to access the V-Safe system.   Mr. Trueba was instructed to call 911 with any severe reactions post vaccine: Marland Kitchen Difficulty breathing  . Swelling of face and throat  . A fast heartbeat  . A bad rash all over body  . Dizziness and weakness   Immunizations Administered    Name Date Dose VIS Date Route   Pfizer COVID-19 Vaccine 03/29/2020  4:25 PM 0.3 mL 01/28/2019 Intramuscular   Manufacturer: [employer], [employer]   Lot: [id number]   NDC: [address]-7

## 2020-06-26 ENCOUNTER — Encounter (HOSPITAL_COMMUNITY): Payer: Self-pay

## 2020-06-26 ENCOUNTER — Ambulatory Visit (HOSPITAL_COMMUNITY)
Admission: EM | Admit: 2020-06-26 | Discharge: 2020-06-26 | Disposition: A | Discharge status: Home / Self Care | Payer: Self-pay | Attending: Emergency Medicine | Admitting: Emergency Medicine

## 2020-06-26 ENCOUNTER — Other Ambulatory Visit: Payer: Self-pay

## 2020-06-26 DIAGNOSIS — N390 Urinary tract infection, site not specified: Secondary | ICD-10-CM | POA: Insufficient documentation

## 2020-06-26 DIAGNOSIS — R319 Hematuria, unspecified: Secondary | ICD-10-CM

## 2020-06-26 DIAGNOSIS — R310 Gross hematuria: Secondary | ICD-10-CM | POA: Insufficient documentation

## 2020-06-26 LAB — POCT URINALYSIS DIP (DEVICE)
Glucose, UA: NEGATIVE mg/dL
Hgb urine dipstick: NEGATIVE
Ketones, ur: 15 mg/dL — AB
Leukocytes,Ua: NEGATIVE
Nitrite: POSITIVE — AB
Protein, ur: NEGATIVE mg/dL
Specific Gravity, Urine: 1.025 (ref 1.005–1.030)
Urobilinogen, UA: 1 mg/dL (ref 0.0–1.0)
pH: 7 (ref 5.0–8.0)

## 2020-06-26 MED ORDER — SULFAMETHOXAZOLE-TRIMETHOPRIM 800-160 MG PO TABS
1.0000 | ORAL_TABLET | Freq: Two times a day (BID) | ORAL | 0 refills | Status: AC
Start: 2020-06-26 — End: 2020-07-03

## 2020-06-26 NOTE — ED Triage Notes (Signed)
Pt presents to UC for blood in urine, burning, and urinary frequency x1 week. Pt denies fever, n/v/d. Pt denies flank pain, or lower abdominal pain.

## 2020-06-26 NOTE — Discharge Instructions (Signed)
Urine showed evidence of infection. We are treating you with bactrim- twice daily x 1 week. Be sure to take full course. Stay hydrated- urine should be pale yellow to clear.   Please return or follow up with your primary provider if symptoms not improving with treatment. Please return sooner if you have worsening of symptoms or develop fever, nausea, vomiting, abdominal pain, back pain, lightheadedness, dizziness.

## 2020-06-27 LAB — URINE CULTURE: Culture: NO GROWTH

## 2020-06-27 NOTE — ED Provider Notes (Signed)
[facility]    CSN: [id number] Arrival date & time: 06/26/20  1304      History   Chief Complaint Chief Complaint  Patient presents with  . Hematuria    HPI Bernard Mcbride is a 34 y.o. male history of paroxysmal A. fib, GERD, presenting today for evaluation of hematuria.  Patient reports that Monday he started noticed that his urine was darker than normal.  He tried drinking plenty of water which did not improve his symptoms, but on Thursday he noticed gross hematuria with urination.  This has subsided, but he notes that his urine continues to be dark.  Denies any frequency.  Has had some mild dysuria.  Denies penile discharge.  Denies any new partners, denies any sexually active over the past few years.  Denies history of prior prostate issues.  Denies prior stone.  Denies back pain or abdominal pain.  Denies nausea vomiting or fever.  HPI  Past Medical History:  Diagnosis Date  . Asthma    seasonal  . Barrett's esophagus   . Cellulitis    recurrent; MRSA; pre-patella, lip  . Colon polyp, hyperplastic   . Elevated TSH    Refered to endocrinologist, Dr. Talmage Coin  . GERD (gastroesophageal reflux disease)   . PAF (paroxysmal atrial fibrillation) (HCC)    a. 06/22/14 in the setting of elevated tsh, converted back to NSR after 300mg  fleicanide. No anticoagulation with CHA2DS-VAsc score of 0.     Patient Active Problem List   Diagnosis Date Noted  . HTN (hypertension) 03/10/2020  . Need for prophylactic vaccination and inoculation against viral hepatitis 01/13/2020  . Medication monitoring encounter 01/13/2020  . Chronic hepatitis C without hepatic coma (HCC) 11/24/2019  . Other headache syndrome 03/08/2017  . Smoker 03/08/2017  . Hyperthyroidism 02/25/2015  . Atrial fibrillation (HCC) 06/22/2014  . Hematemesis 06/30/2013  . Asthma, mild persistent 06/30/2013  . Seasonal allergic rhinitis 06/23/2013  . GERD (gastroesophageal reflux disease) 06/23/2013  .  Chronic low back pain 06/23/2013    History reviewed. No pertinent surgical history.     Home Medications    Prior to Admission medications   Medication Sig Start Date End Date Taking? Authorizing Provider  buprenorphine-naloxone (SUBOXONE) 8-2 mg SUBL SL tablet TAKE 3 TABLETS SUBLINGUALLY QD 09/16/18   [provider]  oxcarbazepine (TRILEPTAL) 600 MG tablet  08/21/18   [provider]  Sofosbuvir-Velpatasvir (EPCLUSA) 400-100 MG TABS Take 1 tablet by mouth daily. Patient not taking: Reported on 03/10/2020 12/08/19   02/05/20, MD  sulfamethoxazole-trimethoprim (BACTRIM DS) 800-160 MG tablet Take 1 tablet by mouth 2 (two) times daily for 7 days. 06/26/20 07/03/20  Jhace Fennell, 07/05/20, PA-C    Family History Family History  Problem Relation Age of Onset  . Diabetes Mother   . Thyroid disease Mother   . Hypertension Father   . Breast cancer Other   . Bladder Cancer Other   . Prostate cancer Other   . Heart attack Other   . Diabetes Other   . Hypertension Other     Social History Social History   Tobacco Use  . Smoking status: Current Every Day Smoker    Packs/day: 0.50    Years: 13.00    Pack years: 6.50    Types: Cigarettes    Last attempt to quit: 11/15/2013    Years since quitting: 6.6  . Smokeless tobacco: Former 11/17/2013    Types: Snuff  . Tobacco comment: working on quitting, down  to <0.5 ppd  Substance Use Topics  . Alcohol use: Yes    Alcohol/week: 1.0 standard drink    Types: 1 Cans of beer per week  . Drug use: No     Allergies   Patient has no known allergies.   Review of Systems Review of Systems  Constitutional: Negative for fever.  HENT: Negative for sore throat.   Respiratory: Negative for shortness of breath.   Cardiovascular: Negative for chest pain.  Gastrointestinal: Negative for abdominal pain, nausea and vomiting.  Genitourinary: Positive for dysuria and hematuria. Negative for difficulty urinating, discharge, frequency,  penile pain, penile swelling, scrotal swelling and testicular pain.  Skin: Negative for rash.  Neurological: Negative for dizziness, light-headedness and headaches.     Physical Exam Triage Vital Signs ED Triage Vitals  Enc Vitals Group     BP 06/26/20 1447 (!) 143/98     Pulse Rate 06/26/20 1443 67     Resp 06/26/20 1443 16     Temp 06/26/20 1443 98.2 F (36.8 C)     Temp Source 06/26/20 1443 Oral     SpO2 06/26/20 1443 98 %     Weight --      Height --      Head Circumference --      Peak Flow --      Pain Score 06/26/20 1445 0     Pain Loc --      Pain Edu? --      Excl. in GC? --    No data found.  Updated Vital Signs BP (!) 143/98 (BP Location: Right Arm)   Pulse 67   Temp 98.2 F (36.8 C) (Oral)   Resp 16   SpO2 98%   Visual Acuity Right Eye Distance:   Left Eye Distance:   Bilateral Distance:    Right Eye Near:   Left Eye Near:    Bilateral Near:     Physical Exam Vitals and nursing note reviewed.  Constitutional:      Appearance: He is well-developed.     Comments: No acute distress  HENT:     Head: Normocephalic and atraumatic.     Nose: Nose normal.  Eyes:     Conjunctiva/sclera: Conjunctivae normal.  Cardiovascular:     Rate and Rhythm: Normal rate.  Pulmonary:     Effort: Pulmonary effort is normal. No respiratory distress.  Abdominal:     General: There is no distension.  Musculoskeletal:        General: Normal range of motion.     Cervical back: Neck supple.  Skin:    General: Skin is warm and dry.  Neurological:     Mental Status: He is alert and oriented to person, place, and time.      UC Treatments / Results  Labs (all labs ordered are listed, but only abnormal results are displayed) Labs Reviewed  POCT URINALYSIS DIP (DEVICE) - Abnormal; Notable for the following components:      Result Value   Bilirubin Urine SMALL (*)    Ketones, ur 15 (*)    Nitrite POSITIVE (*)    All other components within normal limits  URINE  CULTURE    EKG   Radiology No results found.  Procedures Procedures (including critical care time)  Medications Ordered in UC Medications - No data to display  Initial Impression / Assessment and Plan / UC Course  I have reviewed the triage vital signs and the nursing notes.  Pertinent labs &  imaging results that were available during my care of the patient were reviewed by me and considered in my medical decision making (see chart for details).     Positive nitrites on UA, will send for urine culture.  Empirically treating today for UTI with Bactrim twice daily x1 week.  Push fluids.  Discussed strict return precautions. Patient verbalized understanding and is agreeable with plan.  Final Clinical Impressions(s) / UC Diagnoses   Final diagnoses:  Gross hematuria  Lower urinary tract infectious disease     Discharge Instructions     Urine showed evidence of infection. We are treating you with bactrim- twice daily x 1 week. Be sure to take full course. Stay hydrated- urine should be pale yellow to clear.   Please return or follow up with your primary provider if symptoms not improving with treatment. Please return sooner if you have worsening of symptoms or develop fever, nausea, vomiting, abdominal pain, back pain, lightheadedness, dizziness.   ED Prescriptions    Medication Sig Dispense Auth. Provider   sulfamethoxazole-trimethoprim (BACTRIM DS) 800-160 MG tablet Take 1 tablet by mouth 2 (two) times daily for 7 days. 14 tablet Allen Egerton, [place] C, PA-C     PDMP not reviewed this encounter.   Lew Dawes, [place] 06/27/20 [phone]

## 2020-07-12 ENCOUNTER — Ambulatory Visit: Payer: Self-pay | Admitting: Internal Medicine

## 2020-07-14 ENCOUNTER — Ambulatory Visit: Payer: Self-pay | Admitting: Internal Medicine

## 2021-02-07 ENCOUNTER — Other Ambulatory Visit: Payer: Self-pay

## 2021-02-07 ENCOUNTER — Encounter (HOSPITAL_COMMUNITY): Payer: Self-pay

## 2021-02-07 ENCOUNTER — Ambulatory Visit (HOSPITAL_COMMUNITY)
Admission: EM | Admit: 2021-02-07 | Discharge: 2021-02-07 | Disposition: A | Discharge status: Home / Self Care | Payer: Self-pay | Attending: Student | Admitting: Student

## 2021-02-07 DIAGNOSIS — R1110 Vomiting, unspecified: Secondary | ICD-10-CM

## 2021-02-07 DIAGNOSIS — Z87440 Personal history of urinary (tract) infections: Secondary | ICD-10-CM | POA: Insufficient documentation

## 2021-02-07 DIAGNOSIS — M549 Dorsalgia, unspecified: Secondary | ICD-10-CM

## 2021-02-07 DIAGNOSIS — S29012A Strain of muscle and tendon of back wall of thorax, initial encounter: Secondary | ICD-10-CM | POA: Insufficient documentation

## 2021-02-07 DIAGNOSIS — R112 Nausea with vomiting, unspecified: Secondary | ICD-10-CM | POA: Insufficient documentation

## 2021-02-07 DIAGNOSIS — K529 Noninfective gastroenteritis and colitis, unspecified: Secondary | ICD-10-CM | POA: Insufficient documentation

## 2021-02-07 LAB — POCT URINALYSIS DIPSTICK, ED / UC
Bilirubin Urine: NEGATIVE
Glucose, UA: NEGATIVE mg/dL
Hgb urine dipstick: NEGATIVE
Ketones, ur: 15 mg/dL — AB
Leukocytes,Ua: NEGATIVE
Nitrite: NEGATIVE
Protein, ur: NEGATIVE mg/dL
Specific Gravity, Urine: 1.015 (ref 1.005–1.030)
Urobilinogen, UA: 0.2 mg/dL (ref 0.0–1.0)
pH: 6 (ref 5.0–8.0)

## 2021-02-07 MED ORDER — TIZANIDINE HCL 2 MG PO CAPS: 2.0000 mg | ORAL_CAPSULE | Freq: Three times a day (TID) | ORAL | 0 refills | Status: AC

## 2021-02-07 MED ORDER — ONDANSETRON 8 MG PO TBDP: 8.0000 mg | ORAL_TABLET | Freq: Three times a day (TID) | ORAL | 0 refills | Status: AC | PRN

## 2021-02-07 NOTE — ED Provider Notes (Signed)
[facility]    CSN: [id number] Arrival date & time: 02/07/21  1003      History   Chief Complaint Chief Complaint  Patient presents with  . Emesis  . Back Pain    HPI Bernard Mcbride is a 35 y.o. male presenting with nausea and vomiting, low back pain, and history of recent UTI.  History of asthma, Barrett's esophagus, cellulitis, colon polyp, elevated TSH, GERD, A. fib, hep C, smoker, chronic low back pain. -Patient says that he was treated for UTI over 1 week ago.  He is not sure of the antibiotic.  He finished this as directed, and states symptoms improved.  Reports history of recurrent UTI.  Denies hematuria, dysuria, frequency, urgency,  fevers/chills, abdnormal discharge.  -Notes new onset of nausea and vomiting for the last 3 days.  States he is vomiting once or twice daily.  Also endorses generalized crampy abdominal pain.  Denies diarrhea.  States he is able to keep fluids down, but not food.  States that his mom recently also had a stomach bug.  -Patient with a history of low back pain, today presenting with low back pain.  Denies trauma or new exercises. Denies pain shooting down legs, denies numbness in arms/legs, denies weakness in arms/legs, denies saddle anesthesia, denies bowel/bladder incontinence.         HPI  Past Medical History:  Diagnosis Date  . Asthma    seasonal  . Barrett's esophagus   . Cellulitis    recurrent; MRSA; pre-patella, lip  . Colon polyp, hyperplastic   . Elevated TSH    Refered to endocrinologist, Dr. Talmage Coin  . GERD (gastroesophageal reflux disease)   . PAF (paroxysmal atrial fibrillation) (HCC)    a. 06/22/14 in the setting of elevated tsh, converted back to NSR after 300mg  fleicanide. No anticoagulation with CHA2DS-VAsc score of 0.     Patient Active Problem List   Diagnosis Date Noted  . HTN (hypertension) 03/10/2020  . Need for prophylactic vaccination and inoculation against viral hepatitis 01/13/2020  .  Medication monitoring encounter 01/13/2020  . Chronic hepatitis C without hepatic coma (HCC) 11/24/2019  . Other headache syndrome 03/08/2017  . Smoker 03/08/2017  . Hyperthyroidism 02/25/2015  . Atrial fibrillation (HCC) 06/22/2014  . Hematemesis 06/30/2013  . Asthma, mild persistent 06/30/2013  . Seasonal allergic rhinitis 06/23/2013  . GERD (gastroesophageal reflux disease) 06/23/2013  . Chronic low back pain 06/23/2013    History reviewed. No pertinent surgical history.     Home Medications    Prior to Admission medications   Medication Sig Start Date End Date Taking? Authorizing Provider  buprenorphine-naloxone (SUBOXONE) 8-2 mg SUBL SL tablet TAKE 3 TABLETS SUBLINGUALLY QD 09/16/18   [provider]  oxcarbazepine (TRILEPTAL) 600 MG tablet  08/21/18   [provider]  Sofosbuvir-Velpatasvir (EPCLUSA) 400-100 MG TABS Take 1 tablet by mouth daily. Patient not taking: No sig reported 12/08/19   02/05/20, MD    Family History Family History  Problem Relation Age of Onset  . Diabetes Mother   . Thyroid disease Mother   . Hypertension Father   . Breast cancer Other   . Bladder Cancer Other   . Prostate cancer Other   . Heart attack Other   . Diabetes Other   . Hypertension Other     Social History Social History   Tobacco Use  . Smoking status: Current Every Day Smoker    Packs/day: 0.50    Years: 13.00  Pack years: 6.50    Types: Cigarettes    Last attempt to quit: 11/15/2013    Years since quitting: 7.2  . Smokeless tobacco: Former Neurosurgeon    Types: Snuff  . Tobacco comment: working on quitting, down to <0.5 ppd  Substance Use Topics  . Alcohol use: Not Currently    Alcohol/week: 1.0 standard drink    Types: 1 Cans of beer per week  . Drug use: Not Currently    Types: Methamphetamines, Cocaine    Comment: In his 20's heroin, cocaine     Allergies   Patient has no known allergies.   Review of Systems Review of Systems   Constitutional: Negative for appetite change, chills, diaphoresis, fever and unexpected weight change.  HENT: Negative for congestion, ear pain, rhinorrhea, sinus pressure, sinus pain and sore throat.   Eyes: Negative for redness and visual disturbance.  Respiratory: Negative for cough, chest tightness, shortness of breath and wheezing.   Cardiovascular: Negative for chest pain and palpitations.  Gastrointestinal: Positive for abdominal pain, nausea and vomiting. Negative for blood in stool, constipation and diarrhea.  Genitourinary: Negative for decreased urine volume, difficulty urinating, dysuria, flank pain, frequency, genital sores, hematuria, penile discharge, penile pain, penile swelling, scrotal swelling, testicular pain and urgency.  Musculoskeletal: Positive for back pain. Negative for arthralgias, gait problem, joint swelling, myalgias, neck pain and neck stiffness.  Skin: Negative for wound.  Neurological: Negative for dizziness, tremors, seizures, syncope, facial asymmetry, speech difficulty, weakness, light-headedness, numbness and headaches.  Psychiatric/Behavioral: Negative for confusion.  All other systems reviewed and are negative.    Physical Exam Triage Vital Signs ED Triage Vitals  Enc Vitals Group     BP 02/07/21 1157 130/87     Pulse Rate 02/07/21 1157 92     Resp 02/07/21 1157 17     Temp 02/07/21 1157 98.4 F (36.9 C)     Temp Source 02/07/21 1157 Oral     SpO2 02/07/21 1157 98 %     Weight --      Height --      Head Circumference --      Peak Flow --      Pain Score 02/07/21 1159 7     Pain Loc --      Pain Edu? --      Excl. in GC? --    No data found.  Updated Vital Signs BP 130/87 (BP Location: Right Arm)   Pulse 92   Temp 98.4 F (36.9 C) (Oral)   Resp 17   SpO2 98%   Visual Acuity Right Eye Distance:   Left Eye Distance:   Bilateral Distance:    Right Eye Near:   Left Eye Near:    Bilateral Near:     Physical Exam Vitals  reviewed.  Constitutional:      General: He is not in acute distress.    Appearance: Normal appearance. He is not ill-appearing.  HENT:     Head: Normocephalic and atraumatic.  Eyes:     Extraocular Movements: Extraocular movements intact.     Pupils: Pupils are equal, round, and reactive to light.  Cardiovascular:     Rate and Rhythm: Normal rate and regular rhythm.     Heart sounds: Normal heart sounds.  Pulmonary:     Effort: Pulmonary effort is normal.     Breath sounds: Normal breath sounds and air entry. No wheezing, rhonchi or rales.  Abdominal:     General: Abdomen is flat. Bowel  sounds are increased. There is no distension.     Palpations: Abdomen is soft. There is no mass.     Tenderness: There is generalized abdominal tenderness. There is no right CVA tenderness, left CVA tenderness, guarding or rebound. Negative signs include Murphy's sign, Rovsing's sign and McBurney's sign.  Musculoskeletal:     Cervical back: Normal range of motion. No swelling, deformity, signs of trauma, rigidity, spasms, tenderness, bony tenderness or crepitus. No pain with movement.     Thoracic back: Spasms and tenderness present. No swelling, deformity, signs of trauma or bony tenderness. Normal range of motion. No scoliosis.     Lumbar back: No swelling, deformity, signs of trauma, spasms, tenderness or bony tenderness. Normal range of motion. Negative right straight leg raise test and negative left straight leg raise test. No scoliosis.     Comments: Strength 5/5 in UEs and LEs. Thoracic back pain elicited with flexion thoracic spine.  Neurological:     General: No focal deficit present.     Mental Status: He is alert and oriented to person, place, and time.     Cranial Nerves: No cranial nerve deficit.     Comments: Strength 5/5 in UEs and LEs. Gait normal. Sensation intact in UEs and LEs.   Psychiatric:        Mood and Affect: Mood normal.        Behavior: Behavior normal.        Thought  Content: Thought content normal.        Judgment: Judgment normal.      UC Treatments / Results  Labs (all labs ordered are listed, but only abnormal results are displayed) Labs Reviewed  POCT URINALYSIS DIPSTICK, ED / UC - Abnormal; Notable for the following components:      Result Value   Ketones, ur 15 (*)    All other components within normal limits  URINE CULTURE    EKG   Radiology No results found.  Procedures Procedures (including critical care time)  Medications Ordered in UC Medications - No data to display  Initial Impression / Assessment and Plan / UC Course  I have reviewed the triage vital signs and the nursing notes.  Pertinent labs & imaging results that were available during my care of the patient were reviewed by me and considered in my medical decision making (see chart for details).      This patient is a 35 year old male presenting with multiple complaints.  Low back pain, nausea with vomiting, recurrent UTI.  This patient has a history of recurrent UTI, most recently treated 1 week ago; he is not sure of antibiotic.  UA today within normal notes.  Culture sent.  Return precautions discussed.  For nausea, Zofran ODT sent as below.  Rec hydration, brat diet.  For thoracic strain, Zanaflex sent. Patient with a long history of chronic back pain. Red flag symptoms discussed.   This chart was dictated using voice recognition software, Dragon. Despite the best efforts of this provider to proofread and correct errors, errors may still occur which can change documentation meaning.   Final Clinical Impressions(s) / UC Diagnoses   Final diagnoses:  Non-intractable vomiting with nausea, unspecified vomiting type  Gastroenteritis  History of UTI  Strain of thoracic back region   Discharge Instructions   None    ED Prescriptions    None     PDMP not reviewed this encounter.   Rhys Martini, PA-C 02/07/21 1248

## 2021-02-07 NOTE — Discharge Instructions (Addendum)
-  For nausea and vomiting, start the Zofran (ondansetron).  You can dissolve this under your tongue up to 3 times daily for nausea and vomiting. -Drink plenty of fluids like water and Gatorade.  Eat bland foods as tolerated. -For your back pain, try the muscle relaxer-Zanaflex (tizanidine). -Your urine looks normal today, but we are sending a culture to make sure.  If you develop new urinary symptoms like burning with peeing, bloody urine, abdominal pain, worsening of back pain, penile discharge-seek immediate medical attention.

## 2021-02-07 NOTE — ED Triage Notes (Signed)
Pt presents with lower back pain and nausea x  3 days. States nausea happened around 5-7 am  Denies fever, chills, dysuria, abdominal pain.   Reports he had an UTI 1 week ago.

## 2021-02-08 LAB — URINE CULTURE: Culture: NO GROWTH

## 2022-04-20 ENCOUNTER — Encounter: Payer: Self-pay | Admitting: Family Medicine

## 2022-04-20 ENCOUNTER — Ambulatory Visit: Payer: Self-pay | Attending: Family Medicine | Admitting: Family Medicine

## 2022-04-20 VITALS — BP 157/105 | HR 89 | Ht 72.0 in | Wt 223.6 lb

## 2022-04-20 DIAGNOSIS — I10 Essential (primary) hypertension: Secondary | ICD-10-CM

## 2022-04-20 DIAGNOSIS — Z131 Encounter for screening for diabetes mellitus: Secondary | ICD-10-CM

## 2022-04-20 DIAGNOSIS — R682 Dry mouth, unspecified: Secondary | ICD-10-CM

## 2022-04-20 DIAGNOSIS — E0590 Thyrotoxicosis, unspecified without thyrotoxic crisis or storm: Secondary | ICD-10-CM

## 2022-04-20 LAB — POCT GLYCOSYLATED HEMOGLOBIN (HGB A1C): Hemoglobin A1C: 5.6 % (ref 4.0–5.6)

## 2022-04-20 MED ORDER — CARVEDILOL 3.125 MG PO TABS: 3.1250 mg | ORAL_TABLET | Freq: Two times a day (BID) | ORAL | 3 refills | Status: DC

## 2022-04-20 NOTE — Progress Notes (Signed)
Subjective:  Patient ID: Bernard Mcbride, male    DOB: Feb 08, 1986  Age: 36 y.o. MRN: [id number]  CC: New Patient (Initial Visit)   HPI Bernard Mcbride is a 36 y.o. year old male with a history of hepatitis C (completed treatment), currently on Suboxone therapy, hypertension, hyperthyroidism, asthma (seasonal). Paroxysmal atrial fibrillation appears on his chart and he informs me he had this about 7 or 8 years ago when he was using drugs.  Interval History: Currently not under the care of any physician and would like to establish care here. His blood pressure is elevated and he is currently not on an antihypertensive agent.   He has not had chest pains, palpitations, shortness of breath, lightheadedness.  Also not on any medication for hyperthyroidism and last TSH was suppressed in 2016. He notices a dry mouth, polydipsia, polyuria, at the end of the day feels exhausted.  He does have a family history of hypertension and diabetes.  Past Medical History:  Diagnosis Date   Asthma    seasonal   Barrett's esophagus    Cellulitis    recurrent; MRSA; pre-patella, lip   Colon polyp, hyperplastic    Elevated TSH    Refered to endocrinologist, Dr. Delrae Rend   GERD (gastroesophageal reflux disease)    PAF (paroxysmal atrial fibrillation) ([place])    a. 06/22/14 in the setting of elevated tsh, converted back to NSR after 360m fleicanide. No anticoagulation with CHA2DS-VAsc score of 0.     No past surgical history on file.  Family History  Problem Relation Age of Onset   Diabetes Mother    Thyroid disease Mother    Hypertension Father    Breast cancer Other    Bladder Cancer Other    Prostate cancer Other    Heart attack Other    Diabetes Other    Hypertension Other     Social History   Socioeconomic History   Marital status: Single    Spouse name: Not on file   Number of children: 0   Years of education: Assoc.   Highest education level: Not on file  Occupational History    Occupation: Landscaping    Comment: [employer] Tobacco Use   Smoking status: Every Day    Packs/day: 0.50    Years: 13.00    Pack years: 6.50    Types: Cigarettes    Last attempt to quit: 11/15/2013    Years since quitting: 8.4   Smokeless tobacco: Former    Types: Snuff   Tobacco comments:    working on quitting, down to <0.5 ppd  Substance and Sexual Activity   Alcohol use: Not Currently    Alcohol/week: 1.0 standard drink    Types: 1 Cans of beer per week   Drug use: Not Currently    Types: Methamphetamines, Cocaine    Comment: In his 20's heroin, cocaine   Sexual activity: Not on file  Other Topics Concern   Not on file  Social History Narrative   Lives with his parents.   Social Determinants of Health   Financial Resource Strain: Not on file  Food Insecurity: Not on file  Transportation Needs: Not on file  Physical Activity: Not on file  Stress: Not on file  Social Connections: Not on file    No Known Allergies  Outpatient Medications Prior to Visit  Medication Sig Dispense Refill   buprenorphine-naloxone (SUBOXONE) 8-2 mg SUBL SL tablet TAKE 3 TABLETS SUBLINGUALLY QD  3  ondansetron (ZOFRAN ODT) 8 MG disintegrating tablet Take 1 tablet (8 mg total) by mouth every 8 (eight) hours as needed for nausea or vomiting. 20 tablet 0   oxcarbazepine (TRILEPTAL) 600 MG tablet   3   tizanidine (ZANAFLEX) 2 MG capsule Take 1 capsule (2 mg total) by mouth 3 (three) times daily. 21 capsule 0   Sofosbuvir-Velpatasvir (EPCLUSA) 400-100 MG TABS Take 1 tablet by mouth daily. (Patient not taking: Reported on 03/10/2020) 28 tablet 2   Facility-Administered Medications Prior to Visit  Medication Dose Route Frequency Provider Last Rate Last Admin   ibuprofen (ADVIL,MOTRIN) tablet 400 mg  400 mg Oral Once Copland, Gay Filler, MD         ROS Review of Systems  Constitutional:  Negative for activity change and appetite change.  HENT:  Negative for sinus pressure and sore  throat.   Eyes:  Negative for visual disturbance.  Respiratory:  Negative for cough, chest tightness and shortness of breath.   Cardiovascular:  Negative for chest pain and leg swelling.  Gastrointestinal:  Negative for abdominal distention, abdominal pain, constipation and diarrhea.  Endocrine: Negative.   Genitourinary:  Negative for dysuria.  Musculoskeletal:  Negative for joint swelling and myalgias.  Skin:  Negative for rash.  Allergic/Immunologic: Negative.   Neurological:  Negative for weakness, light-headedness and numbness.  Psychiatric/Behavioral:  Negative for dysphoric mood and suicidal ideas.    Objective:  BP (!) 157/105   Pulse 89   Ht 6' (1.829 m)   Wt 223 lb 9.6 oz (101.4 kg)   SpO2 99%   BMI 30.33 kg/m      04/20/2022    8:50 AM 02/07/2021   11:57 AM 06/26/2020    2:47 PM  BP/Weight  Systolic BP 829 562 130  Diastolic BP 865 87 98  Wt. (Lbs) 223.6    BMI 30.33 kg/m2        Physical Exam Constitutional:      Appearance: He is well-developed.  Cardiovascular:     Rate and Rhythm: Normal rate.     Heart sounds: Normal heart sounds. No murmur heard. Pulmonary:     Effort: Pulmonary effort is normal.     Breath sounds: Normal breath sounds. No wheezing or rales.  Chest:     Chest wall: No tenderness.  Abdominal:     General: Bowel sounds are normal. There is no distension.     Palpations: Abdomen is soft. There is no mass.     Tenderness: There is no abdominal tenderness.  Musculoskeletal:        General: Normal range of motion.     Right lower leg: No edema.     Left lower leg: No edema.  Neurological:     Mental Status: He is alert and oriented to person, place, and time.  Psychiatric:        Mood and Affect: Mood normal.       Latest Ref Rng & Units 03/10/2020    9:02 AM 01/13/2020    9:42 AM 11/24/2019    9:48 AM  CMP  Glucose 65 - 99 mg/dL 103   91   101    BUN 7 - 25 mg/dL 12   9   11     Creatinine 0.60 - 1.35 mg/dL 0.85   0.75   0.84     Sodium 135 - 146 mmol/L 138   137   139    Potassium 3.5 - 5.3 mmol/L 4.5   4.4  4.8    Chloride 98 - 110 mmol/L 104   103   104    CO2 20 - 32 mmol/L 30   28   28     Calcium 8.6 - 10.3 mg/dL 9.5   9.8   9.6    Total Protein 6.1 - 8.1 g/dL 7.2   7.3   7.3    Total Bilirubin 0.2 - 1.2 mg/dL 0.2   0.3   0.3    AST 10 - 40 U/L 14   21   30     ALT 9 - 46 U/L 14   11   32     33      Lipid Panel  No results found for: CHOL, TRIG, HDL, CHOLHDL, VLDL, LDLCALC, LDLDIRECT  CBC    Component Value Date/Time   WBC 4.2 11/24/2019 0948   RBC 5.15 11/24/2019 0948   HGB 15.7 11/24/2019 0948   HCT 45.6 11/24/2019 0948   PLT 226 11/24/2019 0948   MCV 88.5 11/24/2019 0948   MCV 83.9 08/10/2015 0928   MCH 30.5 11/24/2019 0948   MCHC 34.4 11/24/2019 0948   RDW 14.1 11/24/2019 0948   LYMPHSABS 1,672 11/24/2019 0948   MONOABS 0.6 12/06/2013 1913   EOSABS 139 11/24/2019 0948   BASOSABS 29 11/24/2019 0948    Lab Results  Component Value Date   HGBA1C 5.6 04/20/2022   Lab Results  Component Value Date   TSH 0.247 (L) 02/15/2015    Assessment & Plan:  1. Primary hypertension Uncontrolled Placed on carvedilol Counseled on blood pressure goal of less than 130/80, low-sodium, DASH diet, medication compliance, 150 minutes of moderate intensity exercise per week. Discussed medication compliance, adverse effects. - carvedilol (COREG) 3.125 MG tablet; Take 1 tablet (3.125 mg total) by mouth 2 (two) times daily with a meal.  Dispense: 60 tablet; Refill: 3 - CMP14+EGFR  2. Hyperthyroidism Last TSH was suppressed We will check again - T4, free - TSH  3. Screening for diabetes mellitus Screening for diabetes is negative - POCT glycosylated hemoglobin (Hb A1C)  4. Dry mouth Advised to use Biotene, stay hydrated    Meds ordered this encounter  Medications   carvedilol (COREG) 3.125 MG tablet    Sig: Take 1 tablet (3.125 mg total) by mouth 2 (two) times daily with a meal.     Dispense:  60 tablet    Refill:  3    Follow-up: Return in about 6 weeks (around 06/01/2022) for Blood Pressure follow-up.       Charlott Rakes, MD, FAAFP. [facility] and [facility] [place], [place]   04/20/2022, 1:07 PM

## 2022-04-20 NOTE — Progress Notes (Signed)
BP concerns

## 2022-04-20 NOTE — Patient Instructions (Signed)

## 2022-04-21 LAB — CMP14+EGFR
ALT: 24 IU/L (ref 0–44)
AST: 24 IU/L (ref 0–40)
Albumin/Globulin Ratio: 1.7 (ref 1.2–2.2)
Albumin: 4.7 g/dL (ref 4.0–5.0)
Alkaline Phosphatase: 89 IU/L (ref 44–121)
BUN/Creatinine Ratio: 9 (ref 9–20)
BUN: 8 mg/dL (ref 6–20)
Bilirubin Total: <0.2 mg/dL (ref 0.0–1.2)
CO2: 21 mmol/L (ref 20–29)
Calcium: 9.6 mg/dL (ref 8.7–10.2)
Chloride: 102 mmol/L (ref 96–106)
Creatinine, Ser: 0.86 mg/dL (ref 0.76–1.27)
Globulin, Total: 2.7 g/dL (ref 1.5–4.5)
Glucose: 102 mg/dL — ABNORMAL HIGH (ref 70–99)
Potassium: 4.7 mmol/L (ref 3.5–5.2)
Sodium: 142 mmol/L (ref 134–144)
Total Protein: 7.4 g/dL (ref 6.0–8.5)
eGFR: 114 mL/min/{1.73_m2} (ref >59)

## 2022-04-21 LAB — TSH: TSH: 0.627 u[IU]/mL (ref 0.450–4.500)

## 2022-04-21 LAB — T4, FREE: Free T4: 1.2 ng/dL (ref 0.82–1.77)

## 2022-06-08 ENCOUNTER — Ambulatory Visit: Payer: Self-pay | Admitting: Family Medicine

## 2022-08-08 ENCOUNTER — Other Ambulatory Visit: Payer: Self-pay | Admitting: Family Medicine

## 2022-08-08 DIAGNOSIS — I10 Essential (primary) hypertension: Secondary | ICD-10-CM

## 2022-09-12 ENCOUNTER — Encounter: Payer: Self-pay | Admitting: Family Medicine

## 2022-09-12 ENCOUNTER — Ambulatory Visit: Payer: Self-pay | Attending: Family Medicine | Admitting: Family Medicine

## 2022-09-12 VITALS — BP 133/92 | HR 87 | Temp 97.9°F | Ht 72.0 in | Wt 213.6 lb

## 2022-09-12 DIAGNOSIS — I10 Essential (primary) hypertension: Secondary | ICD-10-CM

## 2022-09-12 DIAGNOSIS — R1031 Right lower quadrant pain: Secondary | ICD-10-CM

## 2022-09-12 MED ORDER — CARVEDILOL 3.125 MG PO TABS: 3.1250 mg | ORAL_TABLET | Freq: Two times a day (BID) | ORAL | 3 refills | Status: DC

## 2022-09-12 NOTE — Progress Notes (Signed)
Subjective:  Patient ID: Bernard Mcbride, male    DOB: [birth year]  Age: 36 y.o. MRN: [id number]  CC: Hypertension   HPI Bernard Mcbride is a 36 y.o. year old male with a history of hypertension who presents for follow-up visit today.  Interval History: He presents for follow-up of blood pressure as at his last visit his blood pressure was elevated at 157/105 without a previous diagnosis of hypertension.  Carvedilol was initiated at that visit and blood pressure has improved today. Not adherent with a low sodium.  He Complains of pain in his right inguinal region when he coughs for the last couple of weeks but has not noticed any swelling. He does a lot of heavy lifting. Past Medical History:  Diagnosis Date   Asthma    seasonal   Barrett's esophagus    Cellulitis    recurrent; MRSA; pre-patella, lip   Colon polyp, hyperplastic    Elevated TSH    Refered to endocrinologist, Dr. Talmage Coin   GERD (gastroesophageal reflux disease)    PAF (paroxysmal atrial fibrillation) (HCC)    a. 06/22/14 in the setting of elevated tsh, converted back to NSR after 300mg  fleicanide. No anticoagulation with CHA2DS-VAsc score of 0.     No past surgical history on file.  Family History  Problem Relation Age of Onset   Diabetes Mother    Thyroid disease Mother    Hypertension Father    Breast cancer Other    Bladder Cancer Other    Prostate cancer Other    Heart attack Other    Diabetes Other    Hypertension Other     Social History   Socioeconomic History   Marital status: Single    Spouse name: Not on file   Number of children: 0   Years of education: Assoc.   Highest education level: Not on file  Occupational History   Occupation: Landscaping    Comment: [employer]  Tobacco Use   Smoking status: Every Day    Packs/day: 0.50    Years: 13.00    Total pack years: 6.50    Types: Cigarettes    Last attempt to quit: 11/15/2013    Years since quitting: 8.8   Smokeless  tobacco: Former    Types: Snuff   Tobacco comments:    working on quitting, down to <0.5 ppd  Substance and Sexual Activity   Alcohol use: Not Currently    Alcohol/week: 1.0 standard drink of alcohol    Types: 1 Cans of beer per week   Drug use: Not Currently    Types: Methamphetamines, Cocaine    Comment: In his 20's heroin, cocaine   Sexual activity: Not on file  Other Topics Concern   Not on file  Social History Narrative   Lives with his parents.   Social Determinants of Health   Financial Resource Strain: Not on file  Food Insecurity: Not on file  Transportation Needs: Not on file  Physical Activity: Not on file  Stress: Not on file  Social Connections: Not on file    No Known Allergies  Outpatient Medications Prior to Visit  Medication Sig Dispense Refill   buprenorphine-naloxone (SUBOXONE) 8-2 mg SUBL SL tablet TAKE 3 TABLETS SUBLINGUALLY QD  3   oxcarbazepine (TRILEPTAL) 600 MG tablet   3   Sofosbuvir-Velpatasvir (EPCLUSA) 400-100 MG TABS Take 1 tablet by mouth daily. 28 tablet 2   tizanidine (ZANAFLEX) 2 MG capsule Take 1 capsule (2 mg total)  by mouth 3 (three) times daily. 21 capsule 0   carvedilol (COREG) 3.125 MG tablet TAKE 1 TABLET BY MOUTH TWICE A DAY WITH MEALS 60 tablet 3   ondansetron (ZOFRAN ODT) 8 MG disintegrating tablet Take 1 tablet (8 mg total) by mouth every 8 (eight) hours as needed for nausea or vomiting. (Patient not taking: Reported on 09/12/2022) 20 tablet 0   Facility-Administered Medications Prior to Visit  Medication Dose Route Frequency Provider Last Rate Last Admin   ibuprofen (ADVIL,MOTRIN) tablet 400 mg  400 mg Oral Once Copland, Gay Filler, MD         ROS Review of Systems  Constitutional:  Negative for activity change and appetite change.  HENT:  Negative for sinus pressure and sore throat.   Respiratory:  Negative for chest tightness, shortness of breath and wheezing.   Cardiovascular:  Negative for chest pain and palpitations.   Gastrointestinal:  Negative for abdominal distention, abdominal pain and constipation.  Genitourinary: Negative.   Musculoskeletal: Negative.   Psychiatric/Behavioral:  Negative for behavioral problems and dysphoric mood.     Objective:  BP (!) 133/92   Pulse 87   Temp 97.9 F (36.6 C) (Oral)   Ht 6' (1.829 m)   Wt 213 lb 9.6 oz (96.9 kg)   SpO2 98%   BMI 28.97 kg/m      09/12/2022    4:13 PM 04/20/2022    8:50 AM 02/07/2021   11:57 AM  BP/Weight  Systolic BP 350 093 818  Diastolic BP 92 299 87  Wt. (Lbs) 213.6 223.6   BMI 28.97 kg/m2 30.33 kg/m2       Physical Exam Constitutional:      Appearance: He is well-developed.  Cardiovascular:     Rate and Rhythm: Normal rate.     Heart sounds: Normal heart sounds. No murmur heard. Pulmonary:     Effort: Pulmonary effort is normal.     Breath sounds: Normal breath sounds. No wheezing or rales.  Chest:     Chest wall: No tenderness.  Abdominal:     General: Bowel sounds are normal. There is no distension.     Palpations: Abdomen is soft. There is no mass.     Tenderness: There is no abdominal tenderness.  Genitourinary:    Comments: No hernia noticed on examination of bilateral inguinal region but minimal bulge on coughing with associated tenderness Musculoskeletal:        General: Normal range of motion.     Right lower leg: No edema.     Left lower leg: No edema.  Neurological:     Mental Status: He is alert and oriented to person, place, and time.  Psychiatric:        Mood and Affect: Mood normal.        Latest Ref Rng & Units 04/20/2022    9:33 AM 03/10/2020    9:02 AM 01/13/2020    9:42 AM  CMP  Glucose 70 - 99 mg/dL 102  103  91   BUN 6 - 20 mg/dL 8  12  9    Creatinine 0.76 - 1.27 mg/dL 0.86  0.85  0.75   Sodium 134 - 144 mmol/L 142  138  137   Potassium 3.5 - 5.2 mmol/L 4.7  4.5  4.4   Chloride 96 - 106 mmol/L 102  104  103   CO2 20 - 29 mmol/L 21  30  28    Calcium 8.7 - 10.2 mg/dL 9.6  9.5  9.8  Total Protein 6.0 - 8.5 g/dL 7.4  7.2  7.3   Total Bilirubin 0.0 - 1.2 mg/dL <0.2  0.2  0.3   Alkaline Phos 44 - 121 IU/L 89     AST 0 - 40 IU/L 24  14  21    ALT 0 - 44 IU/L 24  14  11      Lipid Panel  No results found for: "CHOL", "TRIG", "HDL", "CHOLHDL", "VLDL", "LDLCALC", "LDLDIRECT"  CBC    Component Value Date/Time   WBC 4.2 11/24/2019 0948   RBC 5.15 11/24/2019 0948   HGB 15.7 11/24/2019 0948   HCT 45.6 11/24/2019 0948   PLT 226 11/24/2019 0948   MCV 88.5 11/24/2019 0948   MCV 83.9 08/10/2015 0928   MCH 30.5 11/24/2019 0948   MCHC 34.4 11/24/2019 0948   RDW 14.1 11/24/2019 0948   LYMPHSABS 1,672 11/24/2019 0948   MONOABS 0.6 12/06/2013 1913   EOSABS 139 11/24/2019 0948   BASOSABS 29 11/24/2019 0948    Lab Results  Component Value Date   HGBA1C 5.6 04/20/2022    Lab Results  Component Value Date   TSH 0.627 04/20/2022    Assessment & Plan:  1. Primary hypertension Slightly above goal He has been nonadherent with low-sodium diet and will be working on this We will reassess for improvement in next visit and consider adjusting his regimen if still above goal - carvedilol (COREG) 3.125 MG tablet; Take 1 tablet (3.125 mg total) by mouth 2 (two) times daily with a meal.  Dispense: 60 tablet; Refill: 3  2. Right inguinal pain Examination is inconclusive for possible hernia - 04/22/2022 Pelvis Limited; Future   Meds ordered this encounter  Medications   carvedilol (COREG) 3.125 MG tablet    Sig: Take 1 tablet (3.125 mg total) by mouth 2 (two) times daily with a meal.    Dispense:  60 tablet    Refill:  3    Follow-up: Return in about 3 months (around 12/13/2022) for Blood Pressure follow-up.       [place], MD, FAAFP. [facility] [place], [facility] [place]   09/12/2022, 5:00 PM

## 2022-09-12 NOTE — Progress Notes (Signed)
Has pain in groin when he coughs.

## 2022-09-12 NOTE — Patient Instructions (Signed)

## 2022-09-19 ENCOUNTER — Ambulatory Visit (HOSPITAL_COMMUNITY): Payer: Self-pay

## 2022-12-19 ENCOUNTER — Ambulatory Visit: Payer: Self-pay | Admitting: Family Medicine

## 2023-04-05 ENCOUNTER — Other Ambulatory Visit: Payer: Self-pay | Admitting: Family Medicine

## 2023-04-05 DIAGNOSIS — I10 Essential (primary) hypertension: Secondary | ICD-10-CM

## 2023-04-16 ENCOUNTER — Other Ambulatory Visit: Payer: Self-pay | Admitting: Family Medicine

## 2023-04-16 DIAGNOSIS — I10 Essential (primary) hypertension: Secondary | ICD-10-CM

## 2023-05-13 ENCOUNTER — Other Ambulatory Visit: Payer: Self-pay | Admitting: Family Medicine

## 2023-05-13 DIAGNOSIS — I10 Essential (primary) hypertension: Secondary | ICD-10-CM

## 2023-05-19 ENCOUNTER — Other Ambulatory Visit: Payer: Self-pay | Admitting: Family Medicine

## 2023-05-19 DIAGNOSIS — I10 Essential (primary) hypertension: Secondary | ICD-10-CM

## 2023-05-21 NOTE — Telephone Encounter (Signed)
Requested medications are due for refill today.  yes  Requested medications are on the active medications list.  yes  Last refill. 04/16/2023 #60 1OX  Future visit scheduled.   no  Notes to clinic.  No pcp listed. Pt needs an appt. Labs are expired.    Requested Prescriptions  Pending Prescriptions Disp Refills   carvedilol (COREG) 3.125 MG tablet [Pharmacy Med Name: CARVEDILOL 3.125 MG TABLET] 60 tablet 0    Sig: TAKE 1 TABLET BY MOUTH TWICE A DAY WITH A MEAL     Cardiovascular: Beta Blockers 3 Failed - 05/19/2023  1:42 PM      Failed - Cr in normal range and within 360 days    Creat  Date Value Ref Range Status  03/10/2020 0.85 0.60 - 1.35 mg/dL Final   Creatinine, Ser  Date Value Ref Range Status  04/20/2022 0.86 0.76 - 1.27 mg/dL Final   Creatinine,U  Date Value Ref Range Status  06/23/2014 143.3 mg/dL Final    Comment:    (NOTE) Cutoff Values for Urine Drug Screen:        Drug Class           Cutoff (ng/mL)        Amphetamines            1000        Barbiturates             200        Cocaine Metabolites      300        Benzodiazepines          200        Methadone                300        Opiates                 2000        Phencyclidine             25        Propoxyphene             300        Marijuana Metabolites     50 For medical purposes only. Performed at [facility]   Creatinine, Urine  Date Value Ref Range Status  06/30/2013 623.0 mg/dL Final    Comment:    (NOTE) Result repeated and verified. Result confirmed by automatic dilution.         Failed - AST in normal range and within 360 days    AST  Date Value Ref Range Status  04/20/2022 24 0 - 40 IU/L Final         Failed - ALT in normal range and within 360 days    ALT  Date Value Ref Range Status  04/20/2022 24 0 - 44 IU/L Final  11/24/2019 33 9 - 46 U/L Final         Failed - Last BP in normal range    BP Readings from Last 1 Encounters:  09/12/22 (!) 133/92          Failed - Valid encounter within last 6 months    Recent Outpatient Visits           8 months ago Primary hypertension   [facility] Tobias, Odette Horns, MD   1 year ago Primary hypertension    [facility] Cash, Ben[place],  MD   6 years ago Other headache syndrome   [facility], [place], [place]   6 years ago Cough   [facility] [place], [place] D, PA-C   7 years ago Pain and swelling of left forearm   [facility], Gwenlyn Found, MD              Passed - Last Heart Rate in normal range    Pulse Readings from Last 1 Encounters:  09/12/22 87
